# Patient Record
Sex: Female | Born: 1935
Health system: Southern US, Community
[De-identification: ages and names within clinical notes are randomized; demographics above are authoritative.]

## PROBLEM LIST (undated history)

## (undated) DIAGNOSIS — I10 Essential (primary) hypertension: Secondary | ICD-10-CM

## (undated) DIAGNOSIS — E119 Type 2 diabetes mellitus without complications: Secondary | ICD-10-CM

## (undated) DIAGNOSIS — F329 Major depressive disorder, single episode, unspecified: Secondary | ICD-10-CM

## (undated) DIAGNOSIS — N189 Chronic kidney disease, unspecified: Secondary | ICD-10-CM

## (undated) DIAGNOSIS — C9210 Chronic myeloid leukemia, BCR/ABL-positive, not having achieved remission: Secondary | ICD-10-CM

## (undated) DIAGNOSIS — F32A Depression, unspecified: Secondary | ICD-10-CM

## (undated) DIAGNOSIS — E785 Hyperlipidemia, unspecified: Secondary | ICD-10-CM

## (undated) DIAGNOSIS — E079 Disorder of thyroid, unspecified: Secondary | ICD-10-CM

## (undated) DIAGNOSIS — D649 Anemia, unspecified: Secondary | ICD-10-CM

## (undated) HISTORY — DX: Major depressive disorder, single episode, unspecified: F32.9

## (undated) HISTORY — DX: Disorder of thyroid, unspecified: E07.9

## (undated) HISTORY — DX: Type 2 diabetes mellitus without complications: E11.9

## (undated) HISTORY — PX: PTCA: SHX146

## (undated) HISTORY — DX: Chronic myeloid leukemia, BCR/ABL-positive, not having achieved remission: C92.10

## (undated) HISTORY — DX: Chronic kidney disease, unspecified: N18.9

## (undated) HISTORY — DX: Depression, unspecified: F32.A

## (undated) HISTORY — PX: APPENDECTOMY: SHX54

## (undated) HISTORY — DX: Essential (primary) hypertension: I10

## (undated) HISTORY — DX: Anemia, unspecified: D64.9

## (undated) HISTORY — DX: Hyperlipidemia, unspecified: E78.5

---

## 2011-10-10 DIAGNOSIS — C9211 Chronic myeloid leukemia, BCR/ABL-positive, in remission: Secondary | ICD-10-CM | POA: Diagnosis not present

## 2011-11-01 DIAGNOSIS — G5760 Lesion of plantar nerve, unspecified lower limb: Secondary | ICD-10-CM | POA: Diagnosis not present

## 2011-11-01 DIAGNOSIS — L03039 Cellulitis of unspecified toe: Secondary | ICD-10-CM | POA: Diagnosis not present

## 2012-01-03 DIAGNOSIS — B353 Tinea pedis: Secondary | ICD-10-CM | POA: Diagnosis not present

## 2012-01-03 DIAGNOSIS — B351 Tinea unguium: Secondary | ICD-10-CM | POA: Diagnosis not present

## 2012-01-03 DIAGNOSIS — L988 Other specified disorders of the skin and subcutaneous tissue: Secondary | ICD-10-CM | POA: Diagnosis not present

## 2012-01-03 DIAGNOSIS — E119 Type 2 diabetes mellitus without complications: Secondary | ICD-10-CM | POA: Diagnosis not present

## 2012-01-03 DIAGNOSIS — L600 Ingrowing nail: Secondary | ICD-10-CM | POA: Diagnosis not present

## 2012-01-11 DIAGNOSIS — I10 Essential (primary) hypertension: Secondary | ICD-10-CM | POA: Diagnosis not present

## 2012-01-11 DIAGNOSIS — D649 Anemia, unspecified: Secondary | ICD-10-CM | POA: Diagnosis not present

## 2012-01-11 DIAGNOSIS — E119 Type 2 diabetes mellitus without complications: Secondary | ICD-10-CM | POA: Diagnosis not present

## 2012-01-11 DIAGNOSIS — N2581 Secondary hyperparathyroidism of renal origin: Secondary | ICD-10-CM | POA: Diagnosis not present

## 2012-01-11 DIAGNOSIS — K7689 Other specified diseases of liver: Secondary | ICD-10-CM | POA: Diagnosis not present

## 2012-01-11 DIAGNOSIS — C9211 Chronic myeloid leukemia, BCR/ABL-positive, in remission: Secondary | ICD-10-CM | POA: Diagnosis not present

## 2012-01-11 DIAGNOSIS — R799 Abnormal finding of blood chemistry, unspecified: Secondary | ICD-10-CM | POA: Diagnosis not present

## 2012-01-16 DIAGNOSIS — C9211 Chronic myeloid leukemia, BCR/ABL-positive, in remission: Secondary | ICD-10-CM | POA: Diagnosis not present

## 2012-02-14 DIAGNOSIS — E119 Type 2 diabetes mellitus without complications: Secondary | ICD-10-CM | POA: Diagnosis not present

## 2012-02-14 DIAGNOSIS — I10 Essential (primary) hypertension: Secondary | ICD-10-CM | POA: Diagnosis not present

## 2012-02-14 DIAGNOSIS — H4089 Other specified glaucoma: Secondary | ICD-10-CM | POA: Diagnosis not present

## 2012-02-14 DIAGNOSIS — E039 Hypothyroidism, unspecified: Secondary | ICD-10-CM | POA: Diagnosis not present

## 2012-02-14 DIAGNOSIS — C9210 Chronic myeloid leukemia, BCR/ABL-positive, not having achieved remission: Secondary | ICD-10-CM | POA: Diagnosis not present

## 2012-02-14 DIAGNOSIS — I2510 Atherosclerotic heart disease of native coronary artery without angina pectoris: Secondary | ICD-10-CM | POA: Diagnosis not present

## 2012-02-14 DIAGNOSIS — G609 Hereditary and idiopathic neuropathy, unspecified: Secondary | ICD-10-CM | POA: Diagnosis not present

## 2012-03-06 DIAGNOSIS — B351 Tinea unguium: Secondary | ICD-10-CM | POA: Diagnosis not present

## 2012-03-06 DIAGNOSIS — L600 Ingrowing nail: Secondary | ICD-10-CM | POA: Diagnosis not present

## 2012-03-06 DIAGNOSIS — I70219 Atherosclerosis of native arteries of extremities with intermittent claudication, unspecified extremity: Secondary | ICD-10-CM | POA: Diagnosis not present

## 2012-03-06 DIAGNOSIS — E119 Type 2 diabetes mellitus without complications: Secondary | ICD-10-CM | POA: Diagnosis not present

## 2012-03-06 DIAGNOSIS — L97509 Non-pressure chronic ulcer of other part of unspecified foot with unspecified severity: Secondary | ICD-10-CM | POA: Diagnosis not present

## 2012-03-12 DIAGNOSIS — I2510 Atherosclerotic heart disease of native coronary artery without angina pectoris: Secondary | ICD-10-CM | POA: Diagnosis not present

## 2012-04-02 DIAGNOSIS — I2510 Atherosclerotic heart disease of native coronary artery without angina pectoris: Secondary | ICD-10-CM | POA: Diagnosis not present

## 2012-04-02 DIAGNOSIS — N390 Urinary tract infection, site not specified: Secondary | ICD-10-CM | POA: Diagnosis not present

## 2012-04-02 DIAGNOSIS — I10 Essential (primary) hypertension: Secondary | ICD-10-CM | POA: Diagnosis not present

## 2012-04-02 DIAGNOSIS — E119 Type 2 diabetes mellitus without complications: Secondary | ICD-10-CM | POA: Diagnosis not present

## 2012-04-02 DIAGNOSIS — E039 Hypothyroidism, unspecified: Secondary | ICD-10-CM | POA: Diagnosis not present

## 2012-04-02 DIAGNOSIS — D649 Anemia, unspecified: Secondary | ICD-10-CM | POA: Diagnosis not present

## 2012-04-02 DIAGNOSIS — C9210 Chronic myeloid leukemia, BCR/ABL-positive, not having achieved remission: Secondary | ICD-10-CM | POA: Diagnosis not present

## 2012-04-02 DIAGNOSIS — G609 Hereditary and idiopathic neuropathy, unspecified: Secondary | ICD-10-CM | POA: Diagnosis not present

## 2012-04-19 DIAGNOSIS — L97909 Non-pressure chronic ulcer of unspecified part of unspecified lower leg with unspecified severity: Secondary | ICD-10-CM | POA: Diagnosis not present

## 2012-06-21 DIAGNOSIS — E119 Type 2 diabetes mellitus without complications: Secondary | ICD-10-CM | POA: Diagnosis not present

## 2012-06-21 DIAGNOSIS — I8390 Asymptomatic varicose veins of unspecified lower extremity: Secondary | ICD-10-CM | POA: Diagnosis not present

## 2012-06-21 DIAGNOSIS — B351 Tinea unguium: Secondary | ICD-10-CM | POA: Diagnosis not present

## 2012-06-21 DIAGNOSIS — L600 Ingrowing nail: Secondary | ICD-10-CM | POA: Diagnosis not present

## 2012-06-21 DIAGNOSIS — L97509 Non-pressure chronic ulcer of other part of unspecified foot with unspecified severity: Secondary | ICD-10-CM | POA: Diagnosis not present

## 2012-07-18 DIAGNOSIS — C9211 Chronic myeloid leukemia, BCR/ABL-positive, in remission: Secondary | ICD-10-CM | POA: Diagnosis not present

## 2012-07-23 DIAGNOSIS — I10 Essential (primary) hypertension: Secondary | ICD-10-CM | POA: Diagnosis not present

## 2012-07-23 DIAGNOSIS — E039 Hypothyroidism, unspecified: Secondary | ICD-10-CM | POA: Diagnosis not present

## 2012-07-23 DIAGNOSIS — C9210 Chronic myeloid leukemia, BCR/ABL-positive, not having achieved remission: Secondary | ICD-10-CM | POA: Diagnosis not present

## 2012-07-23 DIAGNOSIS — E119 Type 2 diabetes mellitus without complications: Secondary | ICD-10-CM | POA: Diagnosis not present

## 2012-08-30 DIAGNOSIS — I10 Essential (primary) hypertension: Secondary | ICD-10-CM | POA: Diagnosis not present

## 2012-09-05 DIAGNOSIS — E119 Type 2 diabetes mellitus without complications: Secondary | ICD-10-CM | POA: Diagnosis not present

## 2012-09-05 DIAGNOSIS — N289 Disorder of kidney and ureter, unspecified: Secondary | ICD-10-CM | POA: Diagnosis not present

## 2012-09-05 DIAGNOSIS — I10 Essential (primary) hypertension: Secondary | ICD-10-CM | POA: Diagnosis not present

## 2012-09-06 DIAGNOSIS — L97509 Non-pressure chronic ulcer of other part of unspecified foot with unspecified severity: Secondary | ICD-10-CM | POA: Diagnosis not present

## 2012-09-06 DIAGNOSIS — B353 Tinea pedis: Secondary | ICD-10-CM | POA: Diagnosis not present

## 2012-09-06 DIAGNOSIS — L988 Other specified disorders of the skin and subcutaneous tissue: Secondary | ICD-10-CM | POA: Diagnosis not present

## 2012-09-06 DIAGNOSIS — B351 Tinea unguium: Secondary | ICD-10-CM | POA: Diagnosis not present

## 2012-09-06 DIAGNOSIS — E1149 Type 2 diabetes mellitus with other diabetic neurological complication: Secondary | ICD-10-CM | POA: Diagnosis not present

## 2012-09-06 DIAGNOSIS — L608 Other nail disorders: Secondary | ICD-10-CM | POA: Diagnosis not present

## 2012-10-18 DIAGNOSIS — L988 Other specified disorders of the skin and subcutaneous tissue: Secondary | ICD-10-CM | POA: Diagnosis not present

## 2012-10-18 DIAGNOSIS — L97509 Non-pressure chronic ulcer of other part of unspecified foot with unspecified severity: Secondary | ICD-10-CM | POA: Diagnosis not present

## 2012-10-18 DIAGNOSIS — B353 Tinea pedis: Secondary | ICD-10-CM | POA: Diagnosis not present

## 2012-11-23 DIAGNOSIS — I10 Essential (primary) hypertension: Secondary | ICD-10-CM | POA: Diagnosis not present

## 2012-11-23 DIAGNOSIS — E039 Hypothyroidism, unspecified: Secondary | ICD-10-CM | POA: Diagnosis not present

## 2013-01-03 DIAGNOSIS — L608 Other nail disorders: Secondary | ICD-10-CM | POA: Diagnosis not present

## 2013-01-03 DIAGNOSIS — B351 Tinea unguium: Secondary | ICD-10-CM | POA: Diagnosis not present

## 2013-01-03 DIAGNOSIS — E1149 Type 2 diabetes mellitus with other diabetic neurological complication: Secondary | ICD-10-CM | POA: Diagnosis not present

## 2013-01-03 DIAGNOSIS — M79609 Pain in unspecified limb: Secondary | ICD-10-CM | POA: Diagnosis not present

## 2013-01-03 DIAGNOSIS — L97509 Non-pressure chronic ulcer of other part of unspecified foot with unspecified severity: Secondary | ICD-10-CM | POA: Diagnosis not present

## 2013-01-23 DIAGNOSIS — C9211 Chronic myeloid leukemia, BCR/ABL-positive, in remission: Secondary | ICD-10-CM | POA: Diagnosis not present

## 2013-03-05 DIAGNOSIS — E039 Hypothyroidism, unspecified: Secondary | ICD-10-CM | POA: Diagnosis not present

## 2013-03-05 DIAGNOSIS — E785 Hyperlipidemia, unspecified: Secondary | ICD-10-CM | POA: Diagnosis not present

## 2013-03-05 DIAGNOSIS — N289 Disorder of kidney and ureter, unspecified: Secondary | ICD-10-CM | POA: Diagnosis not present

## 2013-03-05 DIAGNOSIS — E119 Type 2 diabetes mellitus without complications: Secondary | ICD-10-CM | POA: Diagnosis not present

## 2013-03-05 DIAGNOSIS — Z0000 Encounter for general adult medical examination without abnormal findings: Secondary | ICD-10-CM | POA: Diagnosis not present

## 2013-03-05 DIAGNOSIS — E559 Vitamin D deficiency, unspecified: Secondary | ICD-10-CM | POA: Diagnosis not present

## 2013-05-06 DIAGNOSIS — E039 Hypothyroidism, unspecified: Secondary | ICD-10-CM | POA: Diagnosis not present

## 2013-05-17 DIAGNOSIS — B351 Tinea unguium: Secondary | ICD-10-CM | POA: Diagnosis not present

## 2013-05-17 DIAGNOSIS — L97509 Non-pressure chronic ulcer of other part of unspecified foot with unspecified severity: Secondary | ICD-10-CM | POA: Diagnosis not present

## 2013-05-17 DIAGNOSIS — L259 Unspecified contact dermatitis, unspecified cause: Secondary | ICD-10-CM | POA: Diagnosis not present

## 2013-05-17 DIAGNOSIS — L03039 Cellulitis of unspecified toe: Secondary | ICD-10-CM | POA: Diagnosis not present

## 2013-05-17 DIAGNOSIS — M79609 Pain in unspecified limb: Secondary | ICD-10-CM | POA: Diagnosis not present

## 2013-06-05 DIAGNOSIS — E039 Hypothyroidism, unspecified: Secondary | ICD-10-CM | POA: Diagnosis not present

## 2013-06-05 DIAGNOSIS — I10 Essential (primary) hypertension: Secondary | ICD-10-CM | POA: Diagnosis not present

## 2013-06-05 DIAGNOSIS — E119 Type 2 diabetes mellitus without complications: Secondary | ICD-10-CM | POA: Diagnosis not present

## 2013-06-05 DIAGNOSIS — N289 Disorder of kidney and ureter, unspecified: Secondary | ICD-10-CM | POA: Diagnosis not present

## 2013-06-05 DIAGNOSIS — C9210 Chronic myeloid leukemia, BCR/ABL-positive, not having achieved remission: Secondary | ICD-10-CM | POA: Diagnosis not present

## 2013-07-15 DIAGNOSIS — L03039 Cellulitis of unspecified toe: Secondary | ICD-10-CM | POA: Diagnosis not present

## 2013-07-15 DIAGNOSIS — L97509 Non-pressure chronic ulcer of other part of unspecified foot with unspecified severity: Secondary | ICD-10-CM | POA: Diagnosis not present

## 2013-09-11 DIAGNOSIS — C9211 Chronic myeloid leukemia, BCR/ABL-positive, in remission: Secondary | ICD-10-CM | POA: Diagnosis not present

## 2013-09-12 DIAGNOSIS — B351 Tinea unguium: Secondary | ICD-10-CM | POA: Diagnosis not present

## 2013-09-12 DIAGNOSIS — L97509 Non-pressure chronic ulcer of other part of unspecified foot with unspecified severity: Secondary | ICD-10-CM | POA: Diagnosis not present

## 2013-09-12 DIAGNOSIS — M79609 Pain in unspecified limb: Secondary | ICD-10-CM | POA: Diagnosis not present

## 2013-10-15 DIAGNOSIS — N289 Disorder of kidney and ureter, unspecified: Secondary | ICD-10-CM | POA: Diagnosis not present

## 2013-10-15 DIAGNOSIS — E039 Hypothyroidism, unspecified: Secondary | ICD-10-CM | POA: Diagnosis not present

## 2013-10-15 DIAGNOSIS — E559 Vitamin D deficiency, unspecified: Secondary | ICD-10-CM | POA: Diagnosis not present

## 2013-10-15 DIAGNOSIS — Z1331 Encounter for screening for depression: Secondary | ICD-10-CM | POA: Diagnosis not present

## 2013-10-15 DIAGNOSIS — E119 Type 2 diabetes mellitus without complications: Secondary | ICD-10-CM | POA: Diagnosis not present

## 2013-12-19 DIAGNOSIS — I739 Peripheral vascular disease, unspecified: Secondary | ICD-10-CM | POA: Diagnosis not present

## 2013-12-19 DIAGNOSIS — L97409 Non-pressure chronic ulcer of unspecified heel and midfoot with unspecified severity: Secondary | ICD-10-CM | POA: Diagnosis not present

## 2014-02-05 DIAGNOSIS — E559 Vitamin D deficiency, unspecified: Secondary | ICD-10-CM | POA: Diagnosis not present

## 2014-02-05 DIAGNOSIS — N289 Disorder of kidney and ureter, unspecified: Secondary | ICD-10-CM | POA: Diagnosis not present

## 2014-02-05 DIAGNOSIS — E119 Type 2 diabetes mellitus without complications: Secondary | ICD-10-CM | POA: Diagnosis not present

## 2014-02-05 DIAGNOSIS — I10 Essential (primary) hypertension: Secondary | ICD-10-CM | POA: Diagnosis not present

## 2014-02-05 DIAGNOSIS — R0789 Other chest pain: Secondary | ICD-10-CM | POA: Diagnosis not present

## 2014-02-05 DIAGNOSIS — E039 Hypothyroidism, unspecified: Secondary | ICD-10-CM | POA: Diagnosis not present

## 2014-05-26 DIAGNOSIS — M79609 Pain in unspecified limb: Secondary | ICD-10-CM | POA: Diagnosis not present

## 2014-05-26 DIAGNOSIS — L97409 Non-pressure chronic ulcer of unspecified heel and midfoot with unspecified severity: Secondary | ICD-10-CM | POA: Diagnosis not present

## 2014-05-26 DIAGNOSIS — B351 Tinea unguium: Secondary | ICD-10-CM | POA: Diagnosis not present

## 2014-05-26 DIAGNOSIS — L03039 Cellulitis of unspecified toe: Secondary | ICD-10-CM | POA: Diagnosis not present

## 2014-08-26 DIAGNOSIS — Z79899 Other long term (current) drug therapy: Secondary | ICD-10-CM | POA: Diagnosis not present

## 2014-08-26 DIAGNOSIS — H540 Blindness, both eyes: Secondary | ICD-10-CM | POA: Diagnosis not present

## 2014-08-26 DIAGNOSIS — H3530 Unspecified macular degeneration: Secondary | ICD-10-CM | POA: Diagnosis not present

## 2014-08-26 DIAGNOSIS — C92Z0 Other myeloid leukemia not having achieved remission: Secondary | ICD-10-CM | POA: Diagnosis not present

## 2014-08-26 DIAGNOSIS — H409 Unspecified glaucoma: Secondary | ICD-10-CM | POA: Diagnosis not present

## 2014-08-26 DIAGNOSIS — E785 Hyperlipidemia, unspecified: Secondary | ICD-10-CM | POA: Diagnosis not present

## 2014-08-26 DIAGNOSIS — E119 Type 2 diabetes mellitus without complications: Secondary | ICD-10-CM | POA: Diagnosis not present

## 2014-08-26 DIAGNOSIS — F329 Major depressive disorder, single episode, unspecified: Secondary | ICD-10-CM | POA: Diagnosis not present

## 2014-08-26 DIAGNOSIS — I2510 Atherosclerotic heart disease of native coronary artery without angina pectoris: Secondary | ICD-10-CM | POA: Diagnosis not present

## 2014-08-26 DIAGNOSIS — E559 Vitamin D deficiency, unspecified: Secondary | ICD-10-CM | POA: Diagnosis not present

## 2014-08-26 DIAGNOSIS — Z1389 Encounter for screening for other disorder: Secondary | ICD-10-CM | POA: Diagnosis not present

## 2014-09-01 DIAGNOSIS — H349 Unspecified retinal vascular occlusion: Secondary | ICD-10-CM | POA: Diagnosis not present

## 2014-09-01 DIAGNOSIS — D3131 Benign neoplasm of right choroid: Secondary | ICD-10-CM | POA: Diagnosis not present

## 2014-09-01 DIAGNOSIS — H2513 Age-related nuclear cataract, bilateral: Secondary | ICD-10-CM | POA: Diagnosis not present

## 2014-09-04 DIAGNOSIS — H34812 Central retinal vein occlusion, left eye: Secondary | ICD-10-CM | POA: Diagnosis not present

## 2014-09-04 DIAGNOSIS — H3531 Nonexudative age-related macular degeneration: Secondary | ICD-10-CM | POA: Diagnosis not present

## 2014-09-04 DIAGNOSIS — H35352 Cystoid macular degeneration, left eye: Secondary | ICD-10-CM | POA: Diagnosis not present

## 2014-09-09 ENCOUNTER — Telehealth: Payer: Self-pay | Admitting: Oncology

## 2014-09-09 NOTE — Telephone Encounter (Signed)
s/w patient son and gave np appt for 12/30 @ 3 w/Dr. Jana Hakim, labs 12/21 @ 3:30.

## 2014-09-12 ENCOUNTER — Other Ambulatory Visit: Payer: Self-pay | Admitting: *Deleted

## 2014-09-12 DIAGNOSIS — C9110 Chronic lymphocytic leukemia of B-cell type not having achieved remission: Secondary | ICD-10-CM

## 2014-09-15 ENCOUNTER — Encounter (HOSPITAL_BASED_OUTPATIENT_CLINIC_OR_DEPARTMENT_OTHER): Payer: Self-pay | Admitting: Oncology

## 2014-09-15 ENCOUNTER — Other Ambulatory Visit: Payer: Self-pay

## 2014-09-15 ENCOUNTER — Other Ambulatory Visit: Payer: Self-pay | Admitting: Oncology

## 2014-09-15 DIAGNOSIS — C9210 Chronic myeloid leukemia, BCR/ABL-positive, not having achieved remission: Secondary | ICD-10-CM | POA: Diagnosis not present

## 2014-09-15 DIAGNOSIS — C9110 Chronic lymphocytic leukemia of B-cell type not having achieved remission: Secondary | ICD-10-CM

## 2014-09-15 LAB — COMPREHENSIVE METABOLIC PANEL (CC13)
ALBUMIN: 3.6 g/dL (ref 3.5–5.0)
ALT: 22 U/L (ref 0–55)
AST: 21 U/L (ref 5–34)
Alkaline Phosphatase: 120 U/L (ref 40–150)
Anion Gap: 12 mEq/L — ABNORMAL HIGH (ref 3–11)
BUN: 30.2 mg/dL — AB (ref 7.0–26.0)
CALCIUM: 8.7 mg/dL (ref 8.4–10.4)
CO2: 23 mEq/L (ref 22–29)
Chloride: 106 mEq/L (ref 98–109)
Creatinine: 2.2 mg/dL — ABNORMAL HIGH (ref 0.6–1.1)
EGFR: 20 mL/min/{1.73_m2} — AB (ref >90)
Glucose: 180 mg/dl — ABNORMAL HIGH (ref 70–140)
Potassium: 4.9 mEq/L (ref 3.5–5.1)
SODIUM: 141 meq/L (ref 136–145)
TOTAL PROTEIN: 7.4 g/dL (ref 6.4–8.3)
Total Bilirubin: 0.46 mg/dL (ref 0.20–1.20)

## 2014-09-15 LAB — CBC WITH DIFFERENTIAL/PLATELET
BASO%: 1.1 % (ref 0.0–2.0)
Basophils Absolute: 0.1 10*3/uL (ref 0.0–0.1)
EOS ABS: 0.2 10*3/uL (ref 0.0–0.5)
EOS%: 3.9 % (ref 0.0–7.0)
HCT: 34.3 % — ABNORMAL LOW (ref 34.8–46.6)
HGB: 10.7 g/dL — ABNORMAL LOW (ref 11.6–15.9)
LYMPH%: 16.7 % (ref 14.0–49.7)
MCH: 26 pg (ref 25.1–34.0)
MCHC: 31.2 g/dL — ABNORMAL LOW (ref 31.5–36.0)
MCV: 83.3 fL (ref 79.5–101.0)
MONO#: 0.7 10*3/uL (ref 0.1–0.9)
MONO%: 11.8 % (ref 0.0–14.0)
NEUT#: 3.9 10*3/uL (ref 1.5–6.5)
NEUT%: 66.5 % (ref 38.4–76.8)
Platelets: 180 10*3/uL (ref 145–400)
RBC: 4.12 10*6/uL (ref 3.70–5.45)
RDW: 17.4 % — ABNORMAL HIGH (ref 11.2–14.5)
WBC: 5.9 10*3/uL (ref 3.9–10.3)
lymph#: 1 10*3/uL (ref 0.9–3.3)

## 2014-09-15 LAB — LACTATE DEHYDROGENASE (CC13): LDH: 300 U/L — AB (ref 125–245)

## 2014-09-15 LAB — CHCC SMEAR

## 2014-09-15 LAB — TECHNOLOGIST REVIEW: Technologist Review: NONE SEEN

## 2014-09-16 ENCOUNTER — Encounter: Payer: Self-pay | Admitting: Emergency Medicine

## 2014-09-16 DIAGNOSIS — C9210 Chronic myeloid leukemia, BCR/ABL-positive, not having achieved remission: Secondary | ICD-10-CM | POA: Insufficient documentation

## 2014-09-22 DIAGNOSIS — H349 Unspecified retinal vascular occlusion: Secondary | ICD-10-CM | POA: Diagnosis not present

## 2014-09-22 DIAGNOSIS — H2513 Age-related nuclear cataract, bilateral: Secondary | ICD-10-CM | POA: Diagnosis not present

## 2014-09-23 DIAGNOSIS — Z780 Asymptomatic menopausal state: Secondary | ICD-10-CM | POA: Diagnosis not present

## 2014-09-24 ENCOUNTER — Ambulatory Visit (HOSPITAL_BASED_OUTPATIENT_CLINIC_OR_DEPARTMENT_OTHER): Payer: Medicare Other | Admitting: Oncology

## 2014-09-24 ENCOUNTER — Ambulatory Visit: Payer: Medicare Other

## 2014-09-24 ENCOUNTER — Encounter: Payer: Self-pay | Admitting: Oncology

## 2014-09-24 VITALS — BP 196/66 | HR 66 | Temp 98.3°F | Resp 22 | Ht 62.0 in | Wt 126.0 lb

## 2014-09-24 DIAGNOSIS — R7309 Other abnormal glucose: Secondary | ICD-10-CM | POA: Diagnosis not present

## 2014-09-24 DIAGNOSIS — N184 Chronic kidney disease, stage 4 (severe): Secondary | ICD-10-CM | POA: Diagnosis not present

## 2014-09-24 DIAGNOSIS — N189 Chronic kidney disease, unspecified: Secondary | ICD-10-CM | POA: Diagnosis not present

## 2014-09-24 DIAGNOSIS — E785 Hyperlipidemia, unspecified: Secondary | ICD-10-CM | POA: Diagnosis not present

## 2014-09-24 DIAGNOSIS — I2510 Atherosclerotic heart disease of native coronary artery without angina pectoris: Secondary | ICD-10-CM

## 2014-09-24 DIAGNOSIS — D630 Anemia in neoplastic disease: Secondary | ICD-10-CM | POA: Diagnosis not present

## 2014-09-24 DIAGNOSIS — H3530 Unspecified macular degeneration: Secondary | ICD-10-CM

## 2014-09-24 DIAGNOSIS — C9210 Chronic myeloid leukemia, BCR/ABL-positive, not having achieved remission: Secondary | ICD-10-CM

## 2014-09-24 DIAGNOSIS — E1122 Type 2 diabetes mellitus with diabetic chronic kidney disease: Secondary | ICD-10-CM | POA: Diagnosis not present

## 2014-09-24 DIAGNOSIS — E038 Other specified hypothyroidism: Secondary | ICD-10-CM | POA: Diagnosis not present

## 2014-09-24 DIAGNOSIS — Z9861 Coronary angioplasty status: Secondary | ICD-10-CM | POA: Diagnosis not present

## 2014-09-24 DIAGNOSIS — R7303 Prediabetes: Secondary | ICD-10-CM

## 2014-09-24 NOTE — Progress Notes (Signed)
Checked in new patient with no issues. Her son said she has been approved for medicaid and has not recd card yet. I told him to make sure we get card once they get.

## 2014-09-27 DIAGNOSIS — N184 Chronic kidney disease, stage 4 (severe): Secondary | ICD-10-CM | POA: Insufficient documentation

## 2014-09-27 DIAGNOSIS — E785 Hyperlipidemia, unspecified: Secondary | ICD-10-CM | POA: Insufficient documentation

## 2014-09-27 DIAGNOSIS — R7303 Prediabetes: Secondary | ICD-10-CM | POA: Insufficient documentation

## 2014-09-27 DIAGNOSIS — E119 Type 2 diabetes mellitus without complications: Secondary | ICD-10-CM | POA: Insufficient documentation

## 2014-09-27 DIAGNOSIS — E039 Hypothyroidism, unspecified: Secondary | ICD-10-CM | POA: Insufficient documentation

## 2014-09-27 DIAGNOSIS — D630 Anemia in neoplastic disease: Secondary | ICD-10-CM | POA: Insufficient documentation

## 2014-09-27 DIAGNOSIS — Z9861 Coronary angioplasty status: Secondary | ICD-10-CM

## 2014-09-27 DIAGNOSIS — H3530 Unspecified macular degeneration: Secondary | ICD-10-CM | POA: Insufficient documentation

## 2014-09-27 DIAGNOSIS — I2510 Atherosclerotic heart disease of native coronary artery without angina pectoris: Secondary | ICD-10-CM | POA: Insufficient documentation

## 2014-09-27 NOTE — Progress Notes (Signed)
[facility]  Telephone:(336) [phone] Fax:(336) [phone]     ID: Stacy Hendricks DOB: [birth year]  MR#: [id number]  VOZ#:[id number]  Patient Care Team: Stacy Ly, MD as PCP - General (Internal Medicine) PCP: Stacy Ly, MD GYN: SU:  OTHER MD: Stacy Ivan MD (Stacy Hendricks, phone [phone])  CHIEF COMPLAINT: Chronic myeloid leukemia  CURRENT TREATMENT: Nilotinib   HISTORY OF PRESENT ILLNESS We do not have the records from her [facility] hematologist, but according to the patient: She has had a diagnosis of chronic myeloid leukemia for more than 10 years. She tells me she had "several treatments" which did not work (10 years ago we were using interferon, Hydrea, and oral chemotherapy agents) but that she tried imatinib initially, with no response. She was started on nilotinib "as soon as it came out" (which would've been late 2007) and has been on that medication with good control ever since.  INTERVAL HISTORY:  Stacy Hendricks was evaluated in the hematology clinic 09/24/2014 accompanied by her son Stacy Hendricks.  REVIEW OF SYSTEMS: She tolerates the nilotinib with no side effects that she is aware of. Specifically she denies problems with rash, diarrhea, or edema. She obtains it "for free". What bothers her most is not being able to see because of her eye problems (glaucoma and macular degeneration and doubtless diabetic retinopathy as well). She denies fevers, bleeding, falls, significant weight loss, pain, or other major concerns. A detailed review of systems today was otherwise noncontributory  PAST MEDICAL HISTORY: No past medical history on file.  Include macular degeneration, glaucoma, depression, diabetes mellitus type 2, coronary artery disease status post stenting of native vessel, hyperlipidemia, chronic kidney disease, and hypertension  PAST SURGICAL HISTORY: No past surgical history on file.  Status post appendectomy  FAMILY HISTORY No family history on file.  The  patient's father died from a heart attack at the age of 42. The patient's mother died of "old age" at age 23 following a stroke. The patient is an only child.  GYNECOLOGIC HISTORY:  No LMP recorded.  Menarche age 22, first live birth age 63, she is [place] P1.   SOCIAL HISTORY:  Stacy Hendricks was born in [place]. She worked as a Licensed conveyancer. Her husband of 60 years, Stacy Hendricks, is a retired Chief Financial Officer. Their son, Stacy Hendricks"), moved to [place] 2 years ago and that is why the patient and her husband moved here from [place].    ADVANCED DIRECTIVES:    HEALTH MAINTENANCE: History  Substance Use Topics  . Smoking status: Not on file  . Smokeless tobacco: Not on file  . Alcohol Use: Not on file     Colonoscopy: Never  PAP: Remote  Bone density:  Lipid panel:  Mammography: Refuses  No Known Allergies  Current Outpatient Prescriptions  Medication Sig Dispense Refill  . carvedilol (COREG) 12.5 MG tablet Take 12.5 mg by mouth 2 (two) times daily with a meal.    . clopidogrel (PLAVIX) 75 MG tablet     . CRESTOR 10 MG tablet     . escitalopram (LEXAPRO) 10 MG tablet     . HUMALOG MIX 50/50 KWIKPEN (50-50) 100 UNIT/ML Kwikpen     . JANUVIA 25 MG tablet     . levothyroxine (SYNTHROID, LEVOTHROID) 50 MCG tablet     . lisinopril (PRINIVIL,ZESTRIL) 20 MG tablet     . LUMIGAN 0.01 % SOLN     . nilotinib (TASIGNA) 200 MG capsule Take 400 mg by mouth every 12 (twelve) hours. Give on an  empty stomach 1 hour before or 2 hours after meals.    . COMBIGAN 0.2-0.5 % ophthalmic solution      No current facility-administered medications for this visit.    OBJECTIVE: Elderly white woman who appears stated age  32 Vitals:   09/24/14 1526  BP: 196/66  Pulse: 66  Temp: 98.3 F (36.8 C)  Resp: 22     Body mass index is 23.04 kg/(m^2).    ECOG FS:2 - Symptomatic, <50% confined to bed  Ocular: Sclerae unicteric, pupils round and equal  Ear-nose-throat: Oropharynx clear and moist  Lymphatic: No  cervical or supraclavicular adenopathy Lungs no rales or rhonchi Heart regular rate and rhythm Abd soft, nontender, positive bowel sounds, No splenomegaly  MSK no focal spinal tenderness Neuro: non-focal, well-oriented, appropriate affect Breasts: No masses palpated in either breast, no skin or nipple changes of concern, both axillae were benign.  LAB RESULTS:  Bcr.abl not detected in peripheral blood by quantitative RT-PCR 09/15/2014  CMP     Component Value Date/Time   NA 141 09/15/2014 1610   K 4.9 09/15/2014 1610   CO2 23 09/15/2014 1610   GLUCOSE 180* 09/15/2014 1610   BUN 30.2* 09/15/2014 1610   CREATININE 2.2* 09/15/2014 1610   CALCIUM 8.7 09/15/2014 1610   PROT 7.4 09/15/2014 1610   ALBUMIN 3.6 09/15/2014 1610   AST 21 09/15/2014 1610   ALT 22 09/15/2014 1610   ALKPHOS 120 09/15/2014 1610   BILITOT 0.46 09/15/2014 1610    INo results found for: SPEP, UPEP  Lab Results  Component Value Date   WBC 5.9 09/15/2014   NEUTROABS 3.9 09/15/2014   HGB 10.7* 09/15/2014   HCT 34.3* 09/15/2014   MCV 83.3 09/15/2014   PLT 180 09/15/2014      Chemistry      Component Value Date/Time   NA 141 09/15/2014 1610   K 4.9 09/15/2014 1610   CO2 23 09/15/2014 1610   BUN 30.2* 09/15/2014 1610   CREATININE 2.2* 09/15/2014 1610      Component Value Date/Time   CALCIUM 8.7 09/15/2014 1610   ALKPHOS 120 09/15/2014 1610   AST 21 09/15/2014 1610   ALT 22 09/15/2014 1610   BILITOT 0.46 09/15/2014 1610       No results found for: LABCA2  No components found for: LABCA125  No results for input(s): INR in the last 168 hours.  Urinalysis No results found for: COLORURINE, APPEARANCEUR, LABSPEC, PHURINE, GLUCOSEU, HGBUR, BILIRUBINUR, KETONESUR, PROTEINUR, UROBILINOGEN, NITRITE, LEUKOCYTESUR  STUDIES: No results found.  ASSESSMENT: 81 y.o. [place] woman with a history of chronic myeloid leukemia, on nilotinib since 2007 with continuing good control  PLAN: We spent a  little over an hour today going over Stacy Hendricks's history of CML, and the pathophysiology of that disease. She and her son understand it arises from a translocation that brings the BCR and ABL genes together resulting in a fusion product bcr.abl which is not found a normal white cells but only in chronic myeloid leukemia cells. We can detect very low levels of this fusion product in the blood and it is very favorable that right now we do not detect that at all in Harli's case. In rare cases there are CML cells in the marrow which show a further mutated form of this translocation, but in general my practice in stable patients like Destiny is simply to follow the blood work every 3 months and not do yearly bone marrow biopsies. They also understand that relapses are  rare this far out in the treatment course.    Purcell Nails requested  the records from her [facility] oncologist, Dr Max Fickle ([facility]/ Dept. Hematology, [phone]) for completion, but I do not believe that well affect our management plan.  I am concerned about her chronic kidney disease. Her creatinine clearance at Dr. Silvestre Mesi was 30.8, here it was 20, possibly due to variations in hydration. We did not clarify Chizara's intentions in terms of advance directives, but we will address that issue at the next visit. We did discuss possible referral to a nephrologist but they would like to defer that option until they can discuss it further with Dr. Josph Macho, MD   09/27/2014 11:16 AM Medical Oncology and Hematology [facility] [place],  [address] Tel. [phone]    Fax. [phone]

## 2014-09-29 ENCOUNTER — Telehealth: Payer: Self-pay | Admitting: Oncology

## 2014-09-29 NOTE — Telephone Encounter (Signed)
s.w. pt husband and advised on Feb May June aug and dec appt...ok and aware

## 2014-10-08 ENCOUNTER — Telehealth: Payer: Self-pay | Admitting: Oncology

## 2014-10-08 NOTE — Telephone Encounter (Signed)
Requested medical records to Dr. Verdell Carmine office. Fax # [phone]

## 2014-10-24 ENCOUNTER — Telehealth: Payer: Self-pay | Admitting: Emergency Medicine

## 2014-10-24 NOTE — Telephone Encounter (Signed)
Received a fax from [place] at [facility] stating no records found on this patient.  This nurse called the number per Dr Magrinat's note; the person with medical records at the [facility] states that she can not see any records on this patient from the last 8-10 years.   States that the records from prior to that would be in "storage".

## 2014-10-31 DIAGNOSIS — H401423 Capsular glaucoma with pseudoexfoliation of lens, left eye, severe stage: Secondary | ICD-10-CM | POA: Diagnosis not present

## 2014-10-31 DIAGNOSIS — H2513 Age-related nuclear cataract, bilateral: Secondary | ICD-10-CM | POA: Diagnosis not present

## 2014-11-11 ENCOUNTER — Telehealth: Payer: Self-pay | Admitting: *Deleted

## 2014-11-11 NOTE — Telephone Encounter (Signed)
This RN contacted the [facility] and spoke with Kendrick Fries in medical records.  She states pt's records are " off site "- per discussion she will request records and forwarded them ASAP.  Request for records refaxed to [facility] at [phone].

## 2014-11-18 DIAGNOSIS — E559 Vitamin D deficiency, unspecified: Secondary | ICD-10-CM | POA: Diagnosis not present

## 2014-11-24 ENCOUNTER — Other Ambulatory Visit (HOSPITAL_BASED_OUTPATIENT_CLINIC_OR_DEPARTMENT_OTHER): Payer: Medicare Other

## 2014-11-24 DIAGNOSIS — H3530 Unspecified macular degeneration: Secondary | ICD-10-CM

## 2014-11-24 DIAGNOSIS — C9210 Chronic myeloid leukemia, BCR/ABL-positive, not having achieved remission: Secondary | ICD-10-CM

## 2014-11-24 DIAGNOSIS — Z9861 Coronary angioplasty status: Secondary | ICD-10-CM

## 2014-11-24 DIAGNOSIS — E785 Hyperlipidemia, unspecified: Secondary | ICD-10-CM

## 2014-11-24 DIAGNOSIS — R7303 Prediabetes: Secondary | ICD-10-CM

## 2014-11-24 DIAGNOSIS — I2510 Atherosclerotic heart disease of native coronary artery without angina pectoris: Secondary | ICD-10-CM

## 2014-11-24 DIAGNOSIS — N184 Chronic kidney disease, stage 4 (severe): Secondary | ICD-10-CM

## 2014-11-24 DIAGNOSIS — D630 Anemia in neoplastic disease: Secondary | ICD-10-CM

## 2014-11-24 DIAGNOSIS — E038 Other specified hypothyroidism: Secondary | ICD-10-CM

## 2014-11-24 DIAGNOSIS — E1122 Type 2 diabetes mellitus with diabetic chronic kidney disease: Secondary | ICD-10-CM

## 2014-11-24 LAB — CBC WITH DIFFERENTIAL/PLATELET
BASO%: 1.2 % (ref 0.0–2.0)
Basophils Absolute: 0.1 10*3/uL (ref 0.0–0.1)
EOS%: 6.5 % (ref 0.0–7.0)
Eosinophils Absolute: 0.4 10*3/uL (ref 0.0–0.5)
HEMATOCRIT: 32.2 % — AB (ref 34.8–46.6)
HGB: 10.4 g/dL — ABNORMAL LOW (ref 11.6–15.9)
LYMPH%: 19.6 % (ref 14.0–49.7)
MCH: 26.5 pg (ref 25.1–34.0)
MCHC: 32.3 g/dL (ref 31.5–36.0)
MCV: 82.2 fL (ref 79.5–101.0)
MONO#: 0.8 10*3/uL (ref 0.1–0.9)
MONO%: 11.7 % (ref 0.0–14.0)
NEUT%: 61 % (ref 38.4–76.8)
NEUTROS ABS: 4.1 10*3/uL (ref 1.5–6.5)
PLATELETS: 171 10*3/uL (ref 145–400)
RBC: 3.92 10*6/uL (ref 3.70–5.45)
RDW: 17.2 % — ABNORMAL HIGH (ref 11.2–14.5)
WBC: 6.7 10*3/uL (ref 3.9–10.3)
lymph#: 1.3 10*3/uL (ref 0.9–3.3)

## 2014-11-24 LAB — COMPREHENSIVE METABOLIC PANEL (CC13)
ALT: 18 U/L (ref 0–55)
ANION GAP: 5 meq/L (ref 3–11)
AST: 22 U/L (ref 5–34)
Albumin: 3.5 g/dL (ref 3.5–5.0)
Alkaline Phosphatase: 121 U/L (ref 40–150)
BUN: 27.9 mg/dL — AB (ref 7.0–26.0)
CO2: 20 meq/L — AB (ref 22–29)
CREATININE: 1.9 mg/dL — AB (ref 0.6–1.1)
Calcium: 8.9 mg/dL (ref 8.4–10.4)
Chloride: 114 mEq/L — ABNORMAL HIGH (ref 98–109)
EGFR: 24 mL/min/{1.73_m2} — ABNORMAL LOW (ref >90)
Glucose: 114 mg/dl (ref 70–140)
Potassium: 4.1 mEq/L (ref 3.5–5.1)
Sodium: 139 mEq/L (ref 136–145)
Total Bilirubin: 0.56 mg/dL (ref 0.20–1.20)
Total Protein: 7.1 g/dL (ref 6.4–8.3)

## 2014-12-04 DIAGNOSIS — I129 Hypertensive chronic kidney disease with stage 1 through stage 4 chronic kidney disease, or unspecified chronic kidney disease: Secondary | ICD-10-CM | POA: Diagnosis not present

## 2014-12-04 DIAGNOSIS — N189 Chronic kidney disease, unspecified: Secondary | ICD-10-CM | POA: Diagnosis not present

## 2014-12-04 DIAGNOSIS — E1129 Type 2 diabetes mellitus with other diabetic kidney complication: Secondary | ICD-10-CM | POA: Diagnosis not present

## 2014-12-04 DIAGNOSIS — E785 Hyperlipidemia, unspecified: Secondary | ICD-10-CM | POA: Diagnosis not present

## 2014-12-04 DIAGNOSIS — D649 Anemia, unspecified: Secondary | ICD-10-CM | POA: Diagnosis not present

## 2014-12-10 ENCOUNTER — Other Ambulatory Visit: Payer: Self-pay | Admitting: Nephrology

## 2014-12-10 DIAGNOSIS — N183 Chronic kidney disease, stage 3 unspecified: Secondary | ICD-10-CM

## 2014-12-10 DIAGNOSIS — E1022 Type 1 diabetes mellitus with diabetic chronic kidney disease: Secondary | ICD-10-CM

## 2014-12-11 ENCOUNTER — Ambulatory Visit (HOSPITAL_COMMUNITY)
Admission: RE | Admit: 2014-12-11 | Discharge: 2014-12-11 | Disposition: A | Discharge status: Home / Self Care | Payer: Medicare Other | Source: Ambulatory Visit | Attending: Internal Medicine | Admitting: Internal Medicine

## 2014-12-11 DIAGNOSIS — I129 Hypertensive chronic kidney disease with stage 1 through stage 4 chronic kidney disease, or unspecified chronic kidney disease: Secondary | ICD-10-CM | POA: Diagnosis not present

## 2014-12-11 DIAGNOSIS — R809 Proteinuria, unspecified: Secondary | ICD-10-CM | POA: Diagnosis not present

## 2014-12-11 DIAGNOSIS — N189 Chronic kidney disease, unspecified: Secondary | ICD-10-CM | POA: Diagnosis not present

## 2014-12-12 ENCOUNTER — Other Ambulatory Visit: Payer: Medicare Other

## 2014-12-19 ENCOUNTER — Other Ambulatory Visit: Payer: Medicare Other

## 2015-01-23 DIAGNOSIS — E1129 Type 2 diabetes mellitus with other diabetic kidney complication: Secondary | ICD-10-CM | POA: Diagnosis not present

## 2015-01-23 DIAGNOSIS — E785 Hyperlipidemia, unspecified: Secondary | ICD-10-CM | POA: Diagnosis not present

## 2015-01-23 DIAGNOSIS — I129 Hypertensive chronic kidney disease with stage 1 through stage 4 chronic kidney disease, or unspecified chronic kidney disease: Secondary | ICD-10-CM | POA: Diagnosis not present

## 2015-01-23 DIAGNOSIS — N189 Chronic kidney disease, unspecified: Secondary | ICD-10-CM | POA: Diagnosis not present

## 2015-02-02 ENCOUNTER — Other Ambulatory Visit: Payer: Self-pay | Admitting: Nephrology

## 2015-02-02 DIAGNOSIS — N189 Chronic kidney disease, unspecified: Secondary | ICD-10-CM

## 2015-02-02 DIAGNOSIS — I10 Essential (primary) hypertension: Secondary | ICD-10-CM

## 2015-02-09 ENCOUNTER — Ambulatory Visit (HOSPITAL_COMMUNITY): Payer: Medicare Other | Attending: Cardiovascular Disease

## 2015-02-09 DIAGNOSIS — I2510 Atherosclerotic heart disease of native coronary artery without angina pectoris: Secondary | ICD-10-CM | POA: Diagnosis not present

## 2015-02-09 DIAGNOSIS — N189 Chronic kidney disease, unspecified: Secondary | ICD-10-CM | POA: Diagnosis not present

## 2015-02-09 DIAGNOSIS — Z794 Long term (current) use of insulin: Secondary | ICD-10-CM | POA: Diagnosis not present

## 2015-02-09 DIAGNOSIS — E119 Type 2 diabetes mellitus without complications: Secondary | ICD-10-CM | POA: Insufficient documentation

## 2015-02-09 DIAGNOSIS — I10 Essential (primary) hypertension: Secondary | ICD-10-CM | POA: Insufficient documentation

## 2015-02-09 DIAGNOSIS — E785 Hyperlipidemia, unspecified: Secondary | ICD-10-CM | POA: Diagnosis not present

## 2015-02-24 ENCOUNTER — Other Ambulatory Visit (HOSPITAL_BASED_OUTPATIENT_CLINIC_OR_DEPARTMENT_OTHER): Payer: Medicare Other

## 2015-02-24 DIAGNOSIS — E038 Other specified hypothyroidism: Secondary | ICD-10-CM

## 2015-02-24 DIAGNOSIS — R7303 Prediabetes: Secondary | ICD-10-CM

## 2015-02-24 DIAGNOSIS — C9290 Myeloid leukemia, unspecified, not having achieved remission: Secondary | ICD-10-CM | POA: Diagnosis not present

## 2015-02-24 DIAGNOSIS — C9210 Chronic myeloid leukemia, BCR/ABL-positive, not having achieved remission: Secondary | ICD-10-CM

## 2015-02-24 DIAGNOSIS — N184 Chronic kidney disease, stage 4 (severe): Secondary | ICD-10-CM

## 2015-02-24 DIAGNOSIS — D630 Anemia in neoplastic disease: Secondary | ICD-10-CM

## 2015-02-24 DIAGNOSIS — E1122 Type 2 diabetes mellitus with diabetic chronic kidney disease: Secondary | ICD-10-CM

## 2015-02-24 DIAGNOSIS — E785 Hyperlipidemia, unspecified: Secondary | ICD-10-CM

## 2015-02-24 DIAGNOSIS — H3530 Unspecified macular degeneration: Secondary | ICD-10-CM

## 2015-02-24 DIAGNOSIS — Z9861 Coronary angioplasty status: Secondary | ICD-10-CM

## 2015-02-24 DIAGNOSIS — I2510 Atherosclerotic heart disease of native coronary artery without angina pectoris: Secondary | ICD-10-CM

## 2015-02-24 LAB — CBC WITH DIFFERENTIAL/PLATELET
BASO%: 1.1 % (ref 0.0–2.0)
BASOS ABS: 0.1 10*3/uL (ref 0.0–0.1)
EOS%: 5.3 % (ref 0.0–7.0)
Eosinophils Absolute: 0.3 10*3/uL (ref 0.0–0.5)
HCT: 29.9 % — ABNORMAL LOW (ref 34.8–46.6)
HGB: 9.8 g/dL — ABNORMAL LOW (ref 11.6–15.9)
LYMPH%: 19.7 % (ref 14.0–49.7)
MCH: 26.6 pg (ref 25.1–34.0)
MCHC: 32.8 g/dL (ref 31.5–36.0)
MCV: 81.1 fL (ref 79.5–101.0)
MONO#: 0.7 10*3/uL (ref 0.1–0.9)
MONO%: 11.3 % (ref 0.0–14.0)
NEUT#: 4 10*3/uL (ref 1.5–6.5)
NEUT%: 62.6 % (ref 38.4–76.8)
Platelets: 151 10*3/uL (ref 145–400)
RBC: 3.69 10*6/uL — AB (ref 3.70–5.45)
RDW: 16.6 % — ABNORMAL HIGH (ref 11.2–14.5)
WBC: 6.4 10*3/uL (ref 3.9–10.3)
lymph#: 1.3 10*3/uL (ref 0.9–3.3)

## 2015-02-24 LAB — COMPREHENSIVE METABOLIC PANEL (CC13)
ALT: 22 U/L (ref 0–55)
ANION GAP: 9 meq/L (ref 3–11)
AST: 27 U/L (ref 5–34)
Albumin: 3.5 g/dL (ref 3.5–5.0)
Alkaline Phosphatase: 133 U/L (ref 40–150)
BUN: 22.7 mg/dL (ref 7.0–26.0)
CALCIUM: 8.1 mg/dL — AB (ref 8.4–10.4)
CO2: 21 mEq/L — ABNORMAL LOW (ref 22–29)
CREATININE: 2 mg/dL — AB (ref 0.6–1.1)
Chloride: 112 mEq/L — ABNORMAL HIGH (ref 98–109)
EGFR: 22 mL/min/{1.73_m2} — ABNORMAL LOW (ref >90)
Glucose: 57 mg/dl — ABNORMAL LOW (ref 70–140)
Potassium: 4.4 mEq/L (ref 3.5–5.1)
Sodium: 142 mEq/L (ref 136–145)
Total Bilirubin: 0.6 mg/dL (ref 0.20–1.20)
Total Protein: 6.9 g/dL (ref 6.4–8.3)

## 2015-03-02 ENCOUNTER — Telehealth: Payer: Self-pay | Admitting: Oncology

## 2015-03-02 NOTE — Telephone Encounter (Signed)
Patient son called and moved 6/7 appointment to 7/20. Other appointments remain the same. Son has new appointment date/time.

## 2015-03-03 ENCOUNTER — Ambulatory Visit: Payer: Medicare Other | Admitting: Oncology

## 2015-03-03 DIAGNOSIS — N189 Chronic kidney disease, unspecified: Secondary | ICD-10-CM | POA: Diagnosis not present

## 2015-03-03 DIAGNOSIS — I129 Hypertensive chronic kidney disease with stage 1 through stage 4 chronic kidney disease, or unspecified chronic kidney disease: Secondary | ICD-10-CM | POA: Diagnosis not present

## 2015-03-03 DIAGNOSIS — E785 Hyperlipidemia, unspecified: Secondary | ICD-10-CM | POA: Diagnosis not present

## 2015-03-03 DIAGNOSIS — E1129 Type 2 diabetes mellitus with other diabetic kidney complication: Secondary | ICD-10-CM | POA: Diagnosis not present

## 2015-03-26 DIAGNOSIS — H3531 Nonexudative age-related macular degeneration: Secondary | ICD-10-CM | POA: Diagnosis not present

## 2015-03-26 DIAGNOSIS — H34812 Central retinal vein occlusion, left eye: Secondary | ICD-10-CM | POA: Diagnosis not present

## 2015-03-26 DIAGNOSIS — H401423 Capsular glaucoma with pseudoexfoliation of lens, left eye, severe stage: Secondary | ICD-10-CM | POA: Diagnosis not present

## 2015-04-15 ENCOUNTER — Ambulatory Visit (HOSPITAL_BASED_OUTPATIENT_CLINIC_OR_DEPARTMENT_OTHER): Payer: Medicare Other | Admitting: Oncology

## 2015-04-15 VITALS — BP 171/64 | HR 64 | Temp 98.6°F | Resp 18 | Ht 62.0 in | Wt 123.5 lb

## 2015-04-15 DIAGNOSIS — C9210 Chronic myeloid leukemia, BCR/ABL-positive, not having achieved remission: Secondary | ICD-10-CM

## 2015-04-15 NOTE — Progress Notes (Signed)
[facility]  Telephone:(336) [phone] Fax:(336) [phone]     ID: Stacy Hendricks DOB: 06-Jul-1929  MR#: [id number]  ZLD#:[id number]  Patient Care Team: Crist Infante, MD as PCP - General (Internal Medicine) PCP: Jerlyn Ly, MD GYN: SU:  OTHER MD: Marisue Ivan MD (Felecia Jan, phone [phone])  CHIEF COMPLAINT: Chronic myeloid leukemia  CURRENT TREATMENT: Nilotinib   HISTORY OF PRESENT ILLNESS We do not have the records from her [facility] hematologist, but according to the patient: She has had a diagnosis of chronic myeloid leukemia for more than 10 years. She tells me she had "several treatments" which did not work (10 years ago we were using interferon, Hydrea, and oral chemotherapy agents) but that she tried imatinib initially, with no response. She was started on nilotinib "as soon as it came out" (which would've been late 2007) and has been on that medication with good control ever since.  INTERVAL HISTORY:  Stacy Hendricks returns today for follow-up of her chronic myeloid leukemia accompanied by her son [name]. Stacy Hendricks is tolerating the nilotinib without any side effects that she is aware of. So far they have been using a 2 years supply provided by her prior oncologist. Accordingly they don't know how much it will cost once they obtain the medication here. We just repeated a BCR.ABL probe and the results were not detectable.  Since the last visit here she also saw Jamal Maes in renal. She had some adjustments to her blood pressure medications and both the blood pressure and the renal function appeared to have improved. I gave all that information to glad so Stacy Hendricks can share it with her other physicians  REVIEW OF SYSTEMS: She looks much more positive and relaxed than the first time I saw her. She in other words looks less depressed. Asked what her worse problem is she says she has no worse problems. She left of the fact that her son has 76 and she tells me she hopes her favorite  political team wins the election. In short she has a lively mind. A detailed review of systems today was otherwise stable  PAST MEDICAL HISTORY: No past medical history on file.  Include macular degeneration, glaucoma, depression, diabetes mellitus type 2, coronary artery disease status post stenting of native vessel, hyperlipidemia, chronic kidney disease, and hypertension  PAST SURGICAL HISTORY: No past surgical history on file.  Status post appendectomy  FAMILY HISTORY No family history on file.  The patient's father died from a heart attack at the age of 67. The patient's mother died of "old age" at age 26 following a stroke. The patient is an only child.  GYNECOLOGIC HISTORY:  No LMP recorded.  Menarche age 26, first live birth age 54, she is [place] P1.   SOCIAL HISTORY:  Stacy Hendricks was born in [place]. 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 Their son, Stacy Hendricks"), moved to [place] 2 years ago and that is why the patient and her husband moved here from [place].   HEALTH MAINTENANCE: History  Substance Use Topics  . Smoking status: Not on file  . Smokeless tobacco: Not on file  . Alcohol Use: Not on file     Colonoscopy: Never  PAP: Remote  Bone density:  Lipid panel:  Mammography: Refuses  No Known Allergies  Current Outpatient Prescriptions  Medication Sig Dispense Refill  . carvedilol (COREG) 12.5 MG tablet Take 12.5 mg by mouth 2 (two) times daily with a meal.    .  clopidogrel (PLAVIX) 75 MG tablet     . COMBIGAN 0.2-0.5 % ophthalmic solution     . CRESTOR 10 MG tablet     . escitalopram (LEXAPRO) 10 MG tablet     . HUMALOG MIX 50/50 KWIKPEN (50-50) 100 UNIT/ML Kwikpen     . JANUVIA 25 MG tablet     . levothyroxine (SYNTHROID, LEVOTHROID) 50 MCG tablet     . lisinopril (PRINIVIL,ZESTRIL) 20 MG tablet     . LUMIGAN 0.01 % SOLN     . nilotinib (TASIGNA) 200 MG capsule Take 400 mg by mouth every 12 (twelve) hours.  Give on an empty stomach 1 hour before or 2 hours after meals.     No current facility-administered medications for this visit.    OBJECTIVE: Elderly white woman in no acute distress Filed Vitals:   04/15/15 1437  BP: 171/64  Pulse: 64  Temp: 98.6 F (37 C)  Resp: 18     Body mass index is 22.58 kg/(m^2).    ECOG FS:1 - Symptomatic but completely ambulatory  Sclerae unicteric, EOMs intact Oropharynx clear and moist No cervical or supraclavicular adenopathy Lungs no rales or rhonchi Heart regular rate and rhythm Abd soft, nontender, positive bowel sounds MSK no focal spinal tenderness Neuro: nonfocal, appears well oriented, appropriate affect Breasts: Deferred  LAB RESULTS:  Bcr.abl not detected in peripheral blood by quantitative RT-PCR 09/15/2014 or on repeat 02/24/2015  CMP     Component Value Date/Time   NA 142 02/24/2015 1510   K 4.4 02/24/2015 1510   CO2 21* 02/24/2015 1510   GLUCOSE 57* 02/24/2015 1510   BUN 22.7 02/24/2015 1510   CREATININE 2.0* 02/24/2015 1510   CALCIUM 8.1* 02/24/2015 1510   PROT 6.9 02/24/2015 1510   ALBUMIN 3.5 02/24/2015 1510   AST 27 02/24/2015 1510   ALT 22 02/24/2015 1510   ALKPHOS 133 02/24/2015 1510   BILITOT 0.60 02/24/2015 1510    INo results found for: SPEP, UPEP  Lab Results  Component Value Date   WBC 6.4 02/24/2015   NEUTROABS 4.0 02/24/2015   HGB 9.8* 02/24/2015   HCT 29.9* 02/24/2015   MCV 81.1 02/24/2015   PLT 151 02/24/2015      Chemistry      Component Value Date/Time   NA 142 02/24/2015 1510   K 4.4 02/24/2015 1510   CO2 21* 02/24/2015 1510   BUN 22.7 02/24/2015 1510   CREATININE 2.0* 02/24/2015 1510      Component Value Date/Time   CALCIUM 8.1* 02/24/2015 1510   ALKPHOS 133 02/24/2015 1510   AST 27 02/24/2015 1510   ALT 22 02/24/2015 1510   BILITOT 0.60 02/24/2015 1510       No results found for: LABCA2  No components found for: LABCA125  No results for input(s): INR in the last 168  hours.  Urinalysis No results found for: COLORURINE, APPEARANCEUR, LABSPEC, PHURINE, GLUCOSEU, HGBUR, BILIRUBINUR, KETONESUR, PROTEINUR, UROBILINOGEN, NITRITE, LEUKOCYTESUR  STUDIES: No results found.  ASSESSMENT: 81 y.o. [place] woman with a history of chronic myeloid leukemia, on nilotinib since 2007 with continuing good control  PLAN: Charna is doing quite well from a CML point of view and I'm making no changes in her treatment. She will continue on the nilotinib indefinitely. We did discuss some recent data that suggests in some patients it may be possible to discontinue treatment without recurrence and that if recurrence does develop the cancer would be again sensitive. At this point we are doing quite well  and I see no reason to attempts that.  We have repeatedly requested but never made able to obtain records from her [facility] oncologist, Dr Max Fickle ([facility]/ Dept. Hematology, [phone]).  We will repeat her lab work in December and she will see me again in January. They know to call for any problems that may develop before her next visit here.   Chauncey Cruel, MD   04/15/2015 3:06 PM Medical Oncology and Hematology [facility] [address] [place], [place] [address] Tel. [phone]    Fax. [phone]

## 2015-04-16 ENCOUNTER — Telehealth: Payer: Self-pay | Admitting: Oncology

## 2015-04-16 NOTE — Telephone Encounter (Signed)
Mailed calendar for appointment.

## 2015-05-07 DIAGNOSIS — H34812 Central retinal vein occlusion, left eye: Secondary | ICD-10-CM | POA: Diagnosis not present

## 2015-05-07 DIAGNOSIS — H43812 Vitreous degeneration, left eye: Secondary | ICD-10-CM | POA: Diagnosis not present

## 2015-05-26 ENCOUNTER — Other Ambulatory Visit: Payer: Self-pay | Admitting: *Deleted

## 2015-05-26 ENCOUNTER — Other Ambulatory Visit: Payer: Medicare Other

## 2015-06-30 ENCOUNTER — Other Ambulatory Visit: Payer: Self-pay | Admitting: *Deleted

## 2015-06-30 ENCOUNTER — Other Ambulatory Visit: Payer: Self-pay | Admitting: Oncology

## 2015-06-30 MED ORDER — NILOTINIB HCL 200 MG PO CAPS: 400.0000 mg | ORAL_CAPSULE | Freq: Two times a day (BID) | ORAL | Status: DC

## 2015-06-30 NOTE — Progress Notes (Signed)
Opened in error

## 2015-09-01 ENCOUNTER — Other Ambulatory Visit: Payer: Medicare Other

## 2015-09-03 DIAGNOSIS — H348122 Central retinal vein occlusion, left eye, stable: Secondary | ICD-10-CM | POA: Diagnosis not present

## 2015-09-03 DIAGNOSIS — H401431 Capsular glaucoma with pseudoexfoliation of lens, bilateral, mild stage: Secondary | ICD-10-CM | POA: Diagnosis not present

## 2015-09-03 DIAGNOSIS — H43813 Vitreous degeneration, bilateral: Secondary | ICD-10-CM | POA: Diagnosis not present

## 2015-09-07 DIAGNOSIS — E785 Hyperlipidemia, unspecified: Secondary | ICD-10-CM | POA: Diagnosis not present

## 2015-09-07 DIAGNOSIS — N189 Chronic kidney disease, unspecified: Secondary | ICD-10-CM | POA: Diagnosis not present

## 2015-09-07 DIAGNOSIS — E1129 Type 2 diabetes mellitus with other diabetic kidney complication: Secondary | ICD-10-CM | POA: Diagnosis not present

## 2015-09-07 DIAGNOSIS — C9210 Chronic myeloid leukemia, BCR/ABL-positive, not having achieved remission: Secondary | ICD-10-CM | POA: Diagnosis not present

## 2015-09-07 DIAGNOSIS — I129 Hypertensive chronic kidney disease with stage 1 through stage 4 chronic kidney disease, or unspecified chronic kidney disease: Secondary | ICD-10-CM | POA: Diagnosis not present

## 2015-09-13 NOTE — Progress Notes (Signed)
This encounter was created in error - please disregard.

## 2015-09-17 DIAGNOSIS — H401433 Capsular glaucoma with pseudoexfoliation of lens, bilateral, severe stage: Secondary | ICD-10-CM | POA: Diagnosis not present

## 2015-09-22 ENCOUNTER — Other Ambulatory Visit (HOSPITAL_BASED_OUTPATIENT_CLINIC_OR_DEPARTMENT_OTHER): Payer: Medicare Other

## 2015-09-22 DIAGNOSIS — D630 Anemia in neoplastic disease: Secondary | ICD-10-CM

## 2015-09-22 DIAGNOSIS — C9210 Chronic myeloid leukemia, BCR/ABL-positive, not having achieved remission: Secondary | ICD-10-CM | POA: Diagnosis not present

## 2015-09-22 DIAGNOSIS — Z9861 Coronary angioplasty status: Secondary | ICD-10-CM

## 2015-09-22 DIAGNOSIS — E1122 Type 2 diabetes mellitus with diabetic chronic kidney disease: Secondary | ICD-10-CM

## 2015-09-22 DIAGNOSIS — N184 Chronic kidney disease, stage 4 (severe): Secondary | ICD-10-CM

## 2015-09-22 DIAGNOSIS — E038 Other specified hypothyroidism: Secondary | ICD-10-CM

## 2015-09-22 DIAGNOSIS — E785 Hyperlipidemia, unspecified: Secondary | ICD-10-CM

## 2015-09-22 DIAGNOSIS — I2510 Atherosclerotic heart disease of native coronary artery without angina pectoris: Secondary | ICD-10-CM

## 2015-09-22 DIAGNOSIS — R7303 Prediabetes: Secondary | ICD-10-CM

## 2015-09-22 DIAGNOSIS — H3530 Unspecified macular degeneration: Secondary | ICD-10-CM

## 2015-09-22 LAB — COMPREHENSIVE METABOLIC PANEL
ALT: 16 U/L (ref 0–55)
AST: 18 U/L (ref 5–34)
Albumin: 3.4 g/dL — ABNORMAL LOW (ref 3.5–5.0)
Alkaline Phosphatase: 127 U/L (ref 40–150)
Anion Gap: 9 mEq/L (ref 3–11)
BUN: 22.1 mg/dL (ref 7.0–26.0)
CO2: 18 meq/L — AB (ref 22–29)
CREATININE: 2 mg/dL — AB (ref 0.6–1.1)
Calcium: 8.4 mg/dL (ref 8.4–10.4)
Chloride: 112 mEq/L — ABNORMAL HIGH (ref 98–109)
EGFR: 22 mL/min/{1.73_m2} — AB (ref >90)
GLUCOSE: 220 mg/dL — AB (ref 70–140)
Potassium: 4.2 mEq/L (ref 3.5–5.1)
SODIUM: 140 meq/L (ref 136–145)
TOTAL PROTEIN: 7 g/dL (ref 6.4–8.3)
Total Bilirubin: 1.06 mg/dL (ref 0.20–1.20)

## 2015-09-22 LAB — CBC WITH DIFFERENTIAL/PLATELET
BASO%: 1.1 % (ref 0.0–2.0)
Basophils Absolute: 0.1 10*3/uL (ref 0.0–0.1)
EOS%: 6.9 % (ref 0.0–7.0)
Eosinophils Absolute: 0.4 10*3/uL (ref 0.0–0.5)
HCT: 30.2 % — ABNORMAL LOW (ref 34.8–46.6)
HGB: 9.8 g/dL — ABNORMAL LOW (ref 11.6–15.9)
LYMPH%: 16.5 % (ref 14.0–49.7)
MCH: 26.3 pg (ref 25.1–34.0)
MCHC: 32.5 g/dL (ref 31.5–36.0)
MCV: 81.1 fL (ref 79.5–101.0)
MONO#: 0.7 10*3/uL (ref 0.1–0.9)
MONO%: 11.3 % (ref 0.0–14.0)
NEUT%: 64.2 % (ref 38.4–76.8)
NEUTROS ABS: 3.9 10*3/uL (ref 1.5–6.5)
Platelets: 163 10*3/uL (ref 145–400)
RBC: 3.73 10*6/uL (ref 3.70–5.45)
RDW: 17.3 % — ABNORMAL HIGH (ref 11.2–14.5)
WBC: 6.1 10*3/uL (ref 3.9–10.3)
lymph#: 1 10*3/uL (ref 0.9–3.3)

## 2015-10-14 ENCOUNTER — Ambulatory Visit (HOSPITAL_BASED_OUTPATIENT_CLINIC_OR_DEPARTMENT_OTHER): Payer: Medicare Other | Admitting: Oncology

## 2015-10-14 VITALS — BP 134/52 | HR 57 | Temp 97.6°F | Resp 18 | Ht 62.0 in | Wt 124.4 lb

## 2015-10-14 DIAGNOSIS — C9210 Chronic myeloid leukemia, BCR/ABL-positive, not having achieved remission: Secondary | ICD-10-CM

## 2015-10-14 DIAGNOSIS — E1122 Type 2 diabetes mellitus with diabetic chronic kidney disease: Secondary | ICD-10-CM

## 2015-10-14 DIAGNOSIS — N184 Chronic kidney disease, stage 4 (severe): Secondary | ICD-10-CM

## 2015-10-14 DIAGNOSIS — N183 Chronic kidney disease, stage 3 (moderate): Secondary | ICD-10-CM

## 2015-10-14 MED ORDER — NILOTINIB HCL 200 MG PO CAPS: 400.0000 mg | ORAL_CAPSULE | Freq: Two times a day (BID) | ORAL | Status: DC

## 2015-10-14 MED ORDER — SIMVASTATIN 10 MG PO TABS: 10.0000 mg | ORAL_TABLET | Freq: Every day | ORAL | Status: DC

## 2015-10-14 NOTE — Progress Notes (Signed)
[name]  Telephone:(336) [phone] Fax:(336) [phone]     ID: Stacy Hendricks DOB: 09/13/1929  MR#: [id number]  [id number]  Patient Care Team: Crist Infante, MD as PCP - General (Internal Medicine) PCP: Jerlyn Ly, MD GYN: SU:  OTHER MD: Marisue Ivan MD Felecia Jan, phone [phone]); Jamal Maes MD  CHIEF COMPLAINT: Chronic myeloid leukemia  CURRENT TREATMENT: Nilotinib   HISTORY OF PRESENT ILLNESS  From the original intake note:  We do not have the records from her [facility] hematologist, but according to the patient: She has had a diagnosis of chronic myeloid leukemia for more than 10 years. She tells me she had "several treatments" which did not work (10 years ago we were using interferon, Hydrea, and oral chemotherapy agents) but that she tried imatinib initially, with no response. She was started on nilotinib "as soon as it came out" (which would've been late 2007) and has been on that medication with good control ever since.  Her subsequent history is as detailed below  INTERVAL HISTORY:  Stacy Hendricks returns today for follow-up of her chronic myeloid leukemia accompanied by her son [name]. She continues on nilotinib which she tolerates with no side effects that she is aware of. She obtains a drug at no cost at this point. Repeat BCR ABL probes find undetectable levels of her chronic myeloid leukemia  REVIEW OF SYSTEMS: Stacy Hendricks is generally doing "good". When asked what her worse problem is she says she is weak and forgetful. She says this is "okay" because of her age (but her age is a secret she says). She dresses herself and does all her normal activities of daily living fairly independently. Surprisingly there have been no falls. She does not use a walker. She now is the proud order of one of her sons cats. Aside from these issuesa detailed review of systems today was stable  PAST MEDICAL HISTORY: No past medical history on file.  Include macular  degeneration, glaucoma, depression, diabetes mellitus type 2, coronary artery disease status post stenting of native vessel, hyperlipidemia, chronic kidney disease, and hypertension  PAST SURGICAL HISTORY: No past surgical history on file.  Status post appendectomy  FAMILY HISTORY No family history on file.  The patient's father died from a heart attack at the age of 47. The patient's mother died of "old age" at age 66 following a stroke. The patient is an only child.  GYNECOLOGIC HISTORY:  No LMP recorded.  Menarche age 38, first live birth age 62, she is [place] P1.   SOCIAL HISTORY:  Jakiya was born in [place]. 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 Their son, Stacy Hendricks"), moved to [place] 2 years ago and that is why the patient and her husband moved here from [place].   HEALTH MAINTENANCE: Social History  Substance Use Topics  . Smoking status: Not on file  . Smokeless tobacco: Not on file  . Alcohol Use: Not on file     Colonoscopy: Never  PAP: Remote  Bone density:  Lipid panel:  Mammography: Refuses  No Known Allergies  Current Outpatient Prescriptions  Medication Sig Dispense Refill  . carvedilol (COREG) 12.5 MG tablet Take 12.5 mg by mouth 2 (two) times daily with a meal.    . clopidogrel (PLAVIX) 75 MG tablet     . COMBIGAN 0.2-0.5 % ophthalmic solution     . CRESTOR 10 MG tablet     . escitalopram (LEXAPRO) 10 MG tablet     .  HUMALOG MIX 50/50 KWIKPEN (50-50) 100 UNIT/ML Kwikpen     . JANUVIA 25 MG tablet     . levothyroxine (SYNTHROID, LEVOTHROID) 50 MCG tablet     . lisinopril (PRINIVIL,ZESTRIL) 20 MG tablet     . LUMIGAN 0.01 % SOLN     . nilotinib (TASIGNA) 200 MG capsule Take 2 capsules (400 mg total) by mouth every 12 (twelve) hours. Give on an empty stomach 1 hour before or 2 hours after meals. 180 capsule 6   No current facility-administered medications for this visit.    OBJECTIVE: Elderly white  woman who appears stated age 82 Vitals:   10/14/15 1520  BP: 134/52  Pulse: 57  Temp: 97.6 F (36.4 C)  Resp: 18     Body mass index is 22.75 kg/(m^2).    ECOG FS:0 - Asymptomatic  Sclerae unicteric, bilateral arcus senilis, conjugate movements Oropharynx clear and slightly dry No cervical or supraclavicular adenopathy Lungs no rales or rhonchi Heart regular rate and rhythm Abd soft, nontender, positive bowel sounds, no palpable splenomegaly MSK no focal spinal tenderness Neuro: nonfocal, well oriented, appropriate affect Breasts: Deferred  LAB RESULTS:  Bcr.abl not detected in peripheral blood by quantitative RT-PCR 09/22/2015  CMP     Component Value Date/Time   NA 140 09/22/2015 1456   K 4.2 09/22/2015 1456   CO2 18* 09/22/2015 1456   GLUCOSE 220* 09/22/2015 1456   BUN 22.1 09/22/2015 1456   CREATININE 2.0* 09/22/2015 1456   CALCIUM 8.4 09/22/2015 1456   PROT 7.0 09/22/2015 1456   ALBUMIN 3.4* 09/22/2015 1456   AST 18 09/22/2015 1456   ALT 16 09/22/2015 1456   ALKPHOS 127 09/22/2015 1456   BILITOT 1.06 09/22/2015 1456    INo results found for: SPEP, UPEP  Lab Results  Component Value Date   WBC 6.1 09/22/2015   NEUTROABS 3.9 09/22/2015   HGB 9.8* 09/22/2015   HCT 30.2* 09/22/2015   MCV 81.1 09/22/2015   PLT 163 09/22/2015      Chemistry      Component Value Date/Time   NA 140 09/22/2015 1456   K 4.2 09/22/2015 1456   CO2 18* 09/22/2015 1456   BUN 22.1 09/22/2015 1456   CREATININE 2.0* 09/22/2015 1456      Component Value Date/Time   CALCIUM 8.4 09/22/2015 1456   ALKPHOS 127 09/22/2015 1456   AST 18 09/22/2015 1456   ALT 16 09/22/2015 1456   BILITOT 1.06 09/22/2015 1456       No results found for: LABCA2  No components found for: LABCA125  No results for input(s): INR in the last 168 hours.  Urinalysis No results found for: COLORURINE, APPEARANCEUR, LABSPEC, PHURINE, GLUCOSEU, HGBUR, BILIRUBINUR, KETONESUR, PROTEINUR, UROBILINOGEN,  NITRITE, LEUKOCYTESUR  STUDIES: No results found.  ASSESSMENT: 82 y.o. [place] woman with a history of chronic myeloid leukemia, on nilotinib since 2007 with continuing good control  PLAN: As far as her CML is concerned Stacy Hendricks is doing terrific and we are continuing with nilotinib as before.  Recent data suggests that in many cases where there has been a prolonged molecular response as in this case it is safe to stop the tyrosine kinase. There are some reports of cases remaining in remission 4 years off treatment. The data is relatively new, and since she is tolerating her treatment so well the prudent course I think is to make no change  They tell me her insurance has refused to pay for crest or are they requested an alternative.  I called in simvastatin 10 mg for her. I suspect they will pay for that. They will discuss with her primary care physician, Dr. Joylene Draft, whether he wishes to continue that medication or not when they see him at her next visit.  She will see me again in October. We will start follow-up here on a yearly basis from that point.  Chauncey Cruel, MD   10/14/2015 3:24 PM Medical Oncology and Hematology [facility] [address] [place], [place] [address] Tel. [phone]    Fax. [phone]

## 2015-10-15 ENCOUNTER — Telehealth: Payer: Self-pay | Admitting: Oncology

## 2015-10-15 NOTE — Telephone Encounter (Signed)
Called and left a message with oct 2017 appointments

## 2015-10-22 DIAGNOSIS — H401433 Capsular glaucoma with pseudoexfoliation of lens, bilateral, severe stage: Secondary | ICD-10-CM | POA: Diagnosis not present

## 2015-12-01 DIAGNOSIS — E559 Vitamin D deficiency, unspecified: Secondary | ICD-10-CM | POA: Diagnosis not present

## 2015-12-01 DIAGNOSIS — E784 Other hyperlipidemia: Secondary | ICD-10-CM | POA: Diagnosis not present

## 2015-12-01 DIAGNOSIS — N183 Chronic kidney disease, stage 3 (moderate): Secondary | ICD-10-CM | POA: Diagnosis not present

## 2015-12-01 DIAGNOSIS — E1122 Type 2 diabetes mellitus with diabetic chronic kidney disease: Secondary | ICD-10-CM | POA: Diagnosis not present

## 2015-12-08 DIAGNOSIS — E1122 Type 2 diabetes mellitus with diabetic chronic kidney disease: Secondary | ICD-10-CM | POA: Diagnosis not present

## 2015-12-08 DIAGNOSIS — I10 Essential (primary) hypertension: Secondary | ICD-10-CM | POA: Diagnosis not present

## 2015-12-08 DIAGNOSIS — H540 Blindness, both eyes: Secondary | ICD-10-CM | POA: Diagnosis not present

## 2015-12-08 DIAGNOSIS — E784 Other hyperlipidemia: Secondary | ICD-10-CM | POA: Diagnosis not present

## 2015-12-08 DIAGNOSIS — E559 Vitamin D deficiency, unspecified: Secondary | ICD-10-CM | POA: Diagnosis not present

## 2015-12-08 DIAGNOSIS — H4089 Other specified glaucoma: Secondary | ICD-10-CM | POA: Diagnosis not present

## 2015-12-08 DIAGNOSIS — Z1389 Encounter for screening for other disorder: Secondary | ICD-10-CM | POA: Diagnosis not present

## 2015-12-08 DIAGNOSIS — N183 Chronic kidney disease, stage 3 (moderate): Secondary | ICD-10-CM | POA: Diagnosis not present

## 2015-12-08 DIAGNOSIS — C92Z0 Other myeloid leukemia not having achieved remission: Secondary | ICD-10-CM | POA: Diagnosis not present

## 2015-12-08 DIAGNOSIS — I2510 Atherosclerotic heart disease of native coronary artery without angina pectoris: Secondary | ICD-10-CM | POA: Diagnosis not present

## 2015-12-08 DIAGNOSIS — Z6821 Body mass index (BMI) 21.0-21.9, adult: Secondary | ICD-10-CM | POA: Diagnosis not present

## 2015-12-08 DIAGNOSIS — Z0000 Encounter for general adult medical examination without abnormal findings: Secondary | ICD-10-CM | POA: Diagnosis not present

## 2015-12-09 MED FILL — *TASIGNA 200 MG CAPSULES: 200 | 28 days supply | Qty: 112 | Fill #1

## 2016-02-08 MED FILL — *TASIGNA 200 MG CAPSULES: 200 | 28 days supply | Qty: 112 | Fill #2

## 2016-03-09 DIAGNOSIS — N189 Chronic kidney disease, unspecified: Secondary | ICD-10-CM | POA: Diagnosis not present

## 2016-03-09 DIAGNOSIS — E1129 Type 2 diabetes mellitus with other diabetic kidney complication: Secondary | ICD-10-CM | POA: Diagnosis not present

## 2016-03-09 DIAGNOSIS — I129 Hypertensive chronic kidney disease with stage 1 through stage 4 chronic kidney disease, or unspecified chronic kidney disease: Secondary | ICD-10-CM | POA: Diagnosis not present

## 2016-03-09 DIAGNOSIS — E785 Hyperlipidemia, unspecified: Secondary | ICD-10-CM | POA: Diagnosis not present

## 2016-03-09 DIAGNOSIS — C9210 Chronic myeloid leukemia, BCR/ABL-positive, not having achieved remission: Secondary | ICD-10-CM | POA: Diagnosis not present

## 2016-04-18 MED FILL — *TASIGNA 200 MG CAPSULES: 200 | 28 days supply | Qty: 112 | Fill #3

## 2016-06-13 MED FILL — *TASIGNA 200 MG CAPSULES: 200 | 28 days supply | Qty: 112 | Fill #4

## 2016-06-30 ENCOUNTER — Other Ambulatory Visit (HOSPITAL_BASED_OUTPATIENT_CLINIC_OR_DEPARTMENT_OTHER): Payer: Medicare Other

## 2016-06-30 DIAGNOSIS — C9210 Chronic myeloid leukemia, BCR/ABL-positive, not having achieved remission: Secondary | ICD-10-CM

## 2016-06-30 DIAGNOSIS — N184 Chronic kidney disease, stage 4 (severe): Secondary | ICD-10-CM

## 2016-06-30 DIAGNOSIS — N183 Chronic kidney disease, stage 3 (moderate): Secondary | ICD-10-CM

## 2016-06-30 DIAGNOSIS — E1122 Type 2 diabetes mellitus with diabetic chronic kidney disease: Secondary | ICD-10-CM

## 2016-06-30 LAB — CBC WITH DIFFERENTIAL/PLATELET
BASO%: 1.2 % (ref 0.0–2.0)
Basophils Absolute: 0.1 10*3/uL (ref 0.0–0.1)
EOS ABS: 0.3 10*3/uL (ref 0.0–0.5)
EOS%: 6.2 % (ref 0.0–7.0)
HCT: 32.7 % — ABNORMAL LOW (ref 34.8–46.6)
HGB: 10.8 g/dL — ABNORMAL LOW (ref 11.6–15.9)
LYMPH%: 18.6 % (ref 14.0–49.7)
MCH: 27.5 pg (ref 25.1–34.0)
MCHC: 33.1 g/dL (ref 31.5–36.0)
MCV: 83.2 fL (ref 79.5–101.0)
MONO#: 0.5 10*3/uL (ref 0.1–0.9)
MONO%: 11.5 % (ref 0.0–14.0)
NEUT%: 62.5 % (ref 38.4–76.8)
NEUTROS ABS: 3 10*3/uL (ref 1.5–6.5)
PLATELETS: 147 10*3/uL (ref 145–400)
RBC: 3.93 10*6/uL (ref 3.70–5.45)
RDW: 15.7 % — ABNORMAL HIGH (ref 11.2–14.5)
WBC: 4.8 10*3/uL (ref 3.9–10.3)
lymph#: 0.9 10*3/uL (ref 0.9–3.3)

## 2016-06-30 LAB — COMPREHENSIVE METABOLIC PANEL
ALT: <9 U/L (ref 0–55)
ANION GAP: 7 meq/L (ref 3–11)
AST: 15 U/L (ref 5–34)
Albumin: 3.5 g/dL (ref 3.5–5.0)
Alkaline Phosphatase: 70 U/L (ref 40–150)
BILIRUBIN TOTAL: 0.5 mg/dL (ref 0.20–1.20)
BUN: 28.2 mg/dL — ABNORMAL HIGH (ref 7.0–26.0)
CHLORIDE: 115 meq/L — AB (ref 98–109)
CO2: 22 meq/L (ref 22–29)
Calcium: 8.8 mg/dL (ref 8.4–10.4)
Creatinine: 2.3 mg/dL — ABNORMAL HIGH (ref 0.6–1.1)
EGFR: 19 mL/min/{1.73_m2} — AB (ref >90)
GLUCOSE: 94 mg/dL (ref 70–140)
Potassium: 4.4 mEq/L (ref 3.5–5.1)
SODIUM: 144 meq/L (ref 136–145)
TOTAL PROTEIN: 7.3 g/dL (ref 6.4–8.3)

## 2016-07-14 ENCOUNTER — Ambulatory Visit (HOSPITAL_BASED_OUTPATIENT_CLINIC_OR_DEPARTMENT_OTHER): Payer: Medicare Other | Admitting: Oncology

## 2016-07-14 VITALS — BP 155/47 | HR 66 | Temp 98.5°F | Resp 18 | Ht 62.0 in | Wt 129.6 lb

## 2016-07-14 DIAGNOSIS — C9210 Chronic myeloid leukemia, BCR/ABL-positive, not having achieved remission: Secondary | ICD-10-CM

## 2016-07-14 MED ORDER — NILOTINIB HCL 200 MG PO CAPS: 400.0000 mg | ORAL_CAPSULE | Freq: Two times a day (BID) | ORAL | 6 refills | Status: DC

## 2016-07-14 NOTE — Progress Notes (Signed)
[facility]  Telephone:(336) [phone] Fax:(336) [phone]     ID: Stacy Hendricks DOB: [birth year]  MR#: [id number]  [id number]  Patient Care Team: Crist Infante, MD as PCP - General (Internal Medicine) PCP: Jerlyn Ly, MD GYN: SU:  OTHER MD: Marisue Ivan MD Felecia Jan, phone [phone]); Stacy Maes MD  CHIEF COMPLAINT: Chronic myeloid leukemia  CURRENT TREATMENT: Nilotinib   HISTORY OF PRESENT ILLNESS  From the original intake note:  We do not have the records from her [facility] hematologist, but according to the patient: She has had a diagnosis of chronic myeloid leukemia for more than 10 years. She tells me she had "several treatments" which did not work (10 years ago we were using interferon, Hydrea, and oral chemotherapy agents) but that she tried imatinib initially, with no response. She was started on nilotinib "as soon as it came out" (which would've been late 2007) and has been on that medication with good control ever since.  Her subsequent history is as detailed below  INTERVAL HISTORY:  Stacy Hendricks returns today for follow-up of her chronic myeloid leukemia accompanied by her son Stacy Hendricks. She takes nilotinib twice a day, as she has for many years. She reports no side effects from this medication. They're currently obtaining it free of charge.  REVIEW OF SYSTEMS: Stacy Hendricks has no complaints. [name] is having to leave the house, which is very difficult for her. She is essentially housebound. She denies pain, she denies fever, and she is not doing "anything" at home. She tells me there have been no recent falls but she is certainly high fall risk and she does not have a cane or a walker. She has not hooked up to services for the blind. A detailed review of systems today was otherwise stable  PAST MEDICAL HISTORY: No past medical history on file.  Include macular degeneration, glaucoma, depression, diabetes mellitus type 2, coronary artery disease status  post stenting of native vessel, hyperlipidemia, chronic kidney disease, and hypertension  PAST SURGICAL HISTORY: No past surgical history on file.  Status post appendectomy  FAMILY HISTORY No family history on file.  The patient's father died from a heart attack at the age of 10. The patient's mother died of "old age" at age 8 following a stroke. The patient is an only child.  GYNECOLOGIC HISTORY:  No LMP recorded.  Menarche age 54, first live birth age 77, she is [place] P1.   SOCIAL HISTORY:  Stacy Hendricks was born in [place]. She worked as a Licensed conveyancer. Her husband of 72 years, Stacy Hendricks, is a retired Chief Financial Officer. Their son, Stacy Hendricks"), moved to [place] 2 years ago and that is why the patient and her husband moved here from [place].   HEALTH MAINTENANCE: Social History  Substance Use Topics  . Smoking status: Not on file  . Smokeless tobacco: Not on file  . Alcohol use Not on file     Colonoscopy: Never  PAP: Remote  Bone density:  Lipid panel:  Mammography: Refuses  No Known Allergies  Current Outpatient Prescriptions  Medication Sig Dispense Refill  . carvedilol (COREG) 12.5 MG tablet Take 12.5 mg by mouth 2 (two) times daily with a meal.    . COMBIGAN 0.2-0.5 % ophthalmic solution     . escitalopram (LEXAPRO) 10 MG tablet     . HUMALOG MIX 50/50 KWIKPEN (50-50) 100 UNIT/ML Kwikpen     . JANUVIA 25 MG tablet     . levothyroxine (SYNTHROID, LEVOTHROID) 50 MCG tablet     .  lisinopril (PRINIVIL,ZESTRIL) 20 MG tablet     . LUMIGAN 0.01 % SOLN     . nilotinib (TASIGNA) 200 MG capsule Take 2 capsules (400 mg total) by mouth every 12 (twelve) hours. Give on an empty stomach 1 hour before or 2 hours after meals. 180 capsule 6  . simvastatin (ZOCOR) 10 MG tablet Take 1 tablet (10 mg total) by mouth daily. 90 tablet 4   No current facility-administered medications for this visit.     OBJECTIVE: Elderly white woman Who is blind Vitals:   07/14/16 1559  BP: (!) 155/47    Pulse: 66  Resp: 18  Temp: 98.5 F (36.9 C)     Body mass index is 23.7 kg/m.    ECOG FS:0 - Asymptomatic  Sclerae unicteric Oropharynx clear, slightly dry No cervical or supraclavicular adenopathy Lungs no rales or rhonchi Heart regular rate and rhythm Abd soft, nontender, positive bowel sounds MSK no focal spinal tenderness Neuro: nonfocal, well oriented, frustrated affect Breasts: Deferred    LAB RESULTS:  Bcr.abl not detected in peripheral blood by quantitative RT-PCR 09/22/2015 or 06/30/2016  CMP     Component Value Date/Time   NA 144 06/30/2016 1519   K 4.4 06/30/2016 1519   CO2 22 06/30/2016 1519   GLUCOSE 94 06/30/2016 1519   BUN 28.2 (H) 06/30/2016 1519   CREATININE 2.3 (H) 06/30/2016 1519   CALCIUM 8.8 06/30/2016 1519   PROT 7.3 06/30/2016 1519   ALBUMIN 3.5 06/30/2016 1519   AST 15 06/30/2016 1519   ALT <9 06/30/2016 1519   ALKPHOS 70 06/30/2016 1519   BILITOT 0.50 06/30/2016 1519    INo results found for: SPEP, UPEP  Lab Results  Component Value Date   WBC 4.8 06/30/2016   NEUTROABS 3.0 06/30/2016   HGB 10.8 (L) 06/30/2016   HCT 32.7 (L) 06/30/2016   MCV 83.2 06/30/2016   PLT 147 06/30/2016      Chemistry      Component Value Date/Time   NA 144 06/30/2016 1519   K 4.4 06/30/2016 1519   CO2 22 06/30/2016 1519   BUN 28.2 (H) 06/30/2016 1519   CREATININE 2.3 (H) 06/30/2016 1519      Component Value Date/Time   CALCIUM 8.8 06/30/2016 1519   ALKPHOS 70 06/30/2016 1519   AST 15 06/30/2016 1519   ALT <9 06/30/2016 1519   BILITOT 0.50 06/30/2016 1519       No results found for: LABCA2  No components found for: LABCA125  No results for input(s): INR in the last 168 hours.  Urinalysis No results found for: COLORURINE, APPEARANCEUR, LABSPEC, PHURINE, GLUCOSEU, HGBUR, BILIRUBINUR, KETONESUR, PROTEINUR, UROBILINOGEN, NITRITE, LEUKOCYTESUR  STUDIES: No results found.  ASSESSMENT: 82 y.o. [place] woman with a history of chronic  myeloid leukemia, on nilotinib since 2007 with continuing good control  PLAN: Stacy Hendricks has no evidence of disease activity at this point. This is very favorable.  Patients who have a major molecular response that is lasting such as hers can sometimes be taken off their medication and observed. We discussed this today.  The problem is that observation is difficult for her. We would want to draw her blood 3 or 4 times a year. It is very difficult for her to get out of the house for any reason including physician visits. Also, she really has no side effects from this medication, which she is used to taking for many years.  After much discussion both she and her son prefer to continue on  the nilotinib and not check the labs quite so frequently.  I think she is high fall risk and likely needs at least walking steak or perhaps a walker. She also is not hooked up with services to the blind. I'm going to see if her social workers can help with that.  If there were a way that we could have her blood drawn at her house and brought here for processing, that would be the very best for them  Tentatively I am setting her up or a visit here in one year. She knows to call for any problems that may develop before then.  Chauncey Cruel, MD   07/14/2016 4:35 PM Medical Oncology and Hematology [facility] [address] [place], [place] [address] Tel. [phone]    Fax. [phone]

## 2016-07-15 ENCOUNTER — Other Ambulatory Visit: Payer: Self-pay | Admitting: *Deleted

## 2016-08-04 MED FILL — TASIGNA 200 MG CAPSULE: 200 | 28 days supply | Qty: 112 | Fill #0

## 2016-10-10 MED FILL — TASIGNA 200 MG CAPSULE: 200 | 28 days supply | Qty: 112 | Fill #1

## 2016-11-10 ENCOUNTER — Other Ambulatory Visit: Payer: Self-pay | Admitting: Oncology

## 2017-01-09 DIAGNOSIS — I2510 Atherosclerotic heart disease of native coronary artery without angina pectoris: Secondary | ICD-10-CM | POA: Diagnosis not present

## 2017-01-09 DIAGNOSIS — E559 Vitamin D deficiency, unspecified: Secondary | ICD-10-CM | POA: Diagnosis not present

## 2017-01-09 DIAGNOSIS — N183 Chronic kidney disease, stage 3 (moderate): Secondary | ICD-10-CM | POA: Diagnosis not present

## 2017-01-09 DIAGNOSIS — H548 Legal blindness, as defined in USA: Secondary | ICD-10-CM | POA: Diagnosis not present

## 2017-01-09 DIAGNOSIS — E1121 Type 2 diabetes mellitus with diabetic nephropathy: Secondary | ICD-10-CM | POA: Diagnosis not present

## 2017-01-09 DIAGNOSIS — C92Z0 Other myeloid leukemia not having achieved remission: Secondary | ICD-10-CM | POA: Diagnosis not present

## 2017-01-09 DIAGNOSIS — E784 Other hyperlipidemia: Secondary | ICD-10-CM | POA: Diagnosis not present

## 2017-01-09 DIAGNOSIS — H4089 Other specified glaucoma: Secondary | ICD-10-CM | POA: Diagnosis not present

## 2017-01-09 DIAGNOSIS — Z0000 Encounter for general adult medical examination without abnormal findings: Secondary | ICD-10-CM | POA: Diagnosis not present

## 2017-01-09 DIAGNOSIS — Z1389 Encounter for screening for other disorder: Secondary | ICD-10-CM | POA: Diagnosis not present

## 2017-01-09 DIAGNOSIS — Z6824 Body mass index (BMI) 24.0-24.9, adult: Secondary | ICD-10-CM | POA: Diagnosis not present

## 2017-01-09 DIAGNOSIS — I10 Essential (primary) hypertension: Secondary | ICD-10-CM | POA: Diagnosis not present

## 2017-02-03 ENCOUNTER — Other Ambulatory Visit: Payer: Self-pay | Admitting: Oncology

## 2017-04-28 ENCOUNTER — Other Ambulatory Visit: Payer: Self-pay | Admitting: Oncology

## 2017-07-12 ENCOUNTER — Other Ambulatory Visit: Payer: Self-pay

## 2017-07-12 DIAGNOSIS — C9210 Chronic myeloid leukemia, BCR/ABL-positive, not having achieved remission: Secondary | ICD-10-CM

## 2017-07-12 NOTE — Progress Notes (Signed)
[facility]  Telephone:(336) [phone] Fax:(336) [phone]     ID: Stacy Hendricks DOB: [birth year]  MR#: [id number]  [id number]#:[id number]  Patient Care Team: Crist Infante, MD as PCP - General (Internal Medicine) PCP: Crist Infante, MD GYN: SU:  OTHER MD: Marisue Ivan MD Felecia Jan, phone [phone]); Jamal Maes MD  CHIEF COMPLAINT: Chronic myeloid leukemia  CURRENT TREATMENT: [Nilotinib]   HISTORY OF PRESENT ILLNESS  From the original intake note:  We do not have the records from her [facility] hematologist, but according to the patient: She has had a diagnosis of chronic myeloid leukemia for more than 10 years. She tells me she had "several treatments" which did not work (10 years ago we were using interferon, Hydrea, and oral chemotherapy agents) but that she tried imatinib initially, with no response. She was started on nilotinib "as soon as it came out" (which would've been late 2007) and has been on that medication with good control ever since.  Her subsequent history is as detailed below  INTERVAL HISTORY:  Stacy Hendricks returns today for follow-up of her chronic myelogenous leukemia accompanied by her son Stacy Hendricks. She is doing well overall without any pain. Her son notes that she stopped taking Nilotinib in March 2018 due to increased dry mouth. Following discontinuation of Nilotinib, she noted that her dry mouth symptoms improved.     REVIEW OF SYSTEMS: Stacy Hendricks reports no complaints at this time. She has not attempted to start services for the blind. She denies unusual headaches, visual changes, nausea, vomiting, or dizziness. There has been no unusual cough, phlegm production, or pleurisy. This been no change in bowel or bladder habits. She denies unexplained fatigue or unexplained weight loss, bleeding, rash, or fever. A detailed review of systems was otherwise stable.    PAST MEDICAL HISTORY: No past medical history on file.  Include macular degeneration, glaucoma,  depression, diabetes mellitus type 2, coronary artery disease status post stenting of native vessel, hyperlipidemia, chronic kidney disease, and hypertension  PAST SURGICAL HISTORY: No past surgical history on file.  Status post appendectomy  FAMILY HISTORY No family history on file.  The patient's father died from a heart attack at the age of 31. The patient's mother died of "old age" at age 84 following a stroke. The patient is an only child.  GYNECOLOGIC HISTORY:  No LMP recorded.  Menarche age 75, first live birth age 64, she is Stacy Hendricks P1.   SOCIAL HISTORY:  Stacy Hendricks was born in [place]. 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 Their son, Stacy Hendricks"), moved to [place] 2 years ago and that is why the patient and her husband moved here from [place].   HEALTH MAINTENANCE: Social History  Substance Use Topics  . Smoking status: Not on file  . Smokeless tobacco: Not on file  . Alcohol use Not on file     Colonoscopy: Never  PAP: Remote  Bone density:  Lipid panel:  Mammography: Refuses  No Known Allergies  Current Outpatient Prescriptions  Medication Sig Dispense Refill  . carvedilol (COREG) 12.5 MG tablet Take 12.5 mg by mouth 2 (two) times daily with a meal.    . COMBIGAN 0.2-0.5 % ophthalmic solution     . escitalopram (LEXAPRO) 10 MG tablet     . HUMALOG MIX 50/50 KWIKPEN (50-50) 100 UNIT/ML Kwikpen     . JANUVIA 25 MG tablet     . levothyroxine (SYNTHROID, LEVOTHROID) 50 MCG tablet     .  lisinopril (PRINIVIL,ZESTRIL) 20 MG tablet     . LUMIGAN 0.01 % SOLN     . nilotinib (TASIGNA) 200 MG capsule Take 2 capsules (400 mg total) by mouth every 12 (twelve) hours. Give on an empty stomach 1 hour before or 2 hours after meals. 180 capsule 6  . simvastatin (ZOCOR) 10 MG tablet TAKE 1 TABLET(10 MG) BY MOUTH DAILY 90 tablet 0   No current facility-administered medications for this visit.     OBJECTIVE: Elderly white woman  Who appears stated age 83:   07/13/17 1417  BP: (!) 168/74  Pulse: 66  Resp: 20  Temp: 97.9 F (36.6 C)  SpO2: 99%     Body mass index is 24.23 kg/m.    ECOG FS:1 - Symptomatic but completely ambulatory  Sclerae unicteric Oropharynx clear and moist No cervical or supraclavicular adenopathy Lungs no rales or rhonchi Heart regular rate and rhythm Abd soft, nontender, positive bowel sounds MSK no focal spinal tenderness, no upper extremity lymphedema Neuro: nonfocal, well oriented, appropriate affect Breasts: I do not palpate a mass in either breast. Both axillae are benign.    LAB RESULTS:  Bcr.abl not detected in peripheral blood by quantitative RT-PCR 09/22/2015 or 06/30/2016  CMP     Component Value Date/Time   NA 141 07/13/2017 1331   K 4.1 07/13/2017 1331   CO2 23 07/13/2017 1331   GLUCOSE 250 (H) 07/13/2017 1331   BUN 21.0 07/13/2017 1331   CREATININE 2.1 (H) 07/13/2017 1331   CALCIUM 9.1 07/13/2017 1331   PROT 7.1 07/13/2017 1331   ALBUMIN 3.7 07/13/2017 1331   AST 14 07/13/2017 1331   ALT 6 07/13/2017 1331   ALKPHOS 68 07/13/2017 1331   BILITOT 0.42 07/13/2017 1331    INo results found for: SPEP, UPEP  Lab Results  Component Value Date   WBC 5.9 07/13/2017   NEUTROABS 3.5 07/13/2017   HGB 11.5 (L) 07/13/2017   HCT 34.7 (L) 07/13/2017   MCV 85.9 07/13/2017   PLT 139 (L) 07/13/2017      Chemistry      Component Value Date/Time   NA 141 07/13/2017 1331   K 4.1 07/13/2017 1331   CO2 23 07/13/2017 1331   BUN 21.0 07/13/2017 1331   CREATININE 2.1 (H) 07/13/2017 1331      Component Value Date/Time   CALCIUM 9.1 07/13/2017 1331   ALKPHOS 68 07/13/2017 1331   AST 14 07/13/2017 1331   ALT 6 07/13/2017 1331   BILITOT 0.42 07/13/2017 1331       No results found for: LABCA2  No components found for: LABCA125  No results for input(s): INR in the last 168 hours.  Urinalysis No results found for: COLORURINE, APPEARANCEUR, LABSPEC, PHURINE,  GLUCOSEU, HGBUR, BILIRUBINUR, KETONESUR, PROTEINUR, UROBILINOGEN, NITRITE, LEUKOCYTESUR  STUDIES: No results found.  ASSESSMENT: 83 y.o. [place] woman with a history of chronic myeloid leukemia, on nilotinib since 2007 with continuing good control  PLAN: Stacy Hendricks Took herself off nilotinib as of March 2018. She feels better, has less mouth dryness, and her hemoglobin is also slightly improved.  We have checked to be CR.ABL today. I am hopeful that we'll continue to be "undetectable".  We discussed the fact that patients who have complete responses that are prolonged, such as Stacy Hendricks, may undergo a trial of stopping tyrosine kinase inhibitors. About half the time the tumor does not recur. The other half with time it does recur.  Normally I would recheck Stacy Hendricks's counts every 3 months but  transportation is very difficult for her.  What we are dated is simply discussed the symptoms that would make them want to bring Stacy Hendricks back sooner than a year from now. These include bruising bleeding and fever in particular but also significant weight loss and significant fatigue  Otherwise assuming allthosethingsoccurandthatthelabsdrawntodayremainednegative,shewillseemeagainayearfromnow  Theyknowtocallforotherissuesaswellasneeded.  Magrinat, Virgie Dad, MD  07/13/17 2:40 PM Medical Oncology and Hematology [facility] [address] [place], [place] [address] Tel. [phone]    Fax. [phone]  This document serves as a record of services personally performed by Lurline Del, MD. It was created on her behalf by Steva Colder, a trained medical scribe. The creation of this record is based on the scribe's personal observations and the provider's statements to them. This document has been checked and approved by the attending provider.

## 2017-07-13 ENCOUNTER — Other Ambulatory Visit (HOSPITAL_BASED_OUTPATIENT_CLINIC_OR_DEPARTMENT_OTHER): Payer: Medicare Other

## 2017-07-13 ENCOUNTER — Ambulatory Visit (HOSPITAL_BASED_OUTPATIENT_CLINIC_OR_DEPARTMENT_OTHER): Payer: Medicare Other | Admitting: Oncology

## 2017-07-13 VITALS — BP 168/74 | HR 66 | Temp 97.9°F | Resp 20 | Ht 62.0 in | Wt 132.5 lb

## 2017-07-13 DIAGNOSIS — C9210 Chronic myeloid leukemia, BCR/ABL-positive, not having achieved remission: Secondary | ICD-10-CM

## 2017-07-13 LAB — COMPREHENSIVE METABOLIC PANEL
ALK PHOS: 68 U/L (ref 40–150)
ALT: 6 U/L (ref 0–55)
ANION GAP: 12 meq/L — AB (ref 3–11)
AST: 14 U/L (ref 5–34)
Albumin: 3.7 g/dL (ref 3.5–5.0)
BILIRUBIN TOTAL: 0.42 mg/dL (ref 0.20–1.20)
BUN: 21 mg/dL (ref 7.0–26.0)
CALCIUM: 9.1 mg/dL (ref 8.4–10.4)
CO2: 23 mEq/L (ref 22–29)
Chloride: 107 mEq/L (ref 98–109)
Creatinine: 2.1 mg/dL — ABNORMAL HIGH (ref 0.6–1.1)
EGFR: 21 mL/min/{1.73_m2} — ABNORMAL LOW (ref >60)
Glucose: 250 mg/dl — ABNORMAL HIGH (ref 70–140)
Potassium: 4.1 mEq/L (ref 3.5–5.1)
Sodium: 141 mEq/L (ref 136–145)
Total Protein: 7.1 g/dL (ref 6.4–8.3)

## 2017-07-13 LAB — CBC WITH DIFFERENTIAL/PLATELET
BASO%: 1.6 % (ref 0.0–2.0)
Basophils Absolute: 0.1 10*3/uL (ref 0.0–0.1)
EOS ABS: 0.5 10*3/uL (ref 0.0–0.5)
EOS%: 7.8 % — ABNORMAL HIGH (ref 0.0–7.0)
HEMATOCRIT: 34.7 % — AB (ref 34.8–46.6)
HGB: 11.5 g/dL — ABNORMAL LOW (ref 11.6–15.9)
LYMPH%: 19.3 % (ref 14.0–49.7)
MCH: 28.4 pg (ref 25.1–34.0)
MCHC: 33.1 g/dL (ref 31.5–36.0)
MCV: 85.9 fL (ref 79.5–101.0)
MONO#: 0.7 10*3/uL (ref 0.1–0.9)
MONO%: 11.8 % (ref 0.0–14.0)
NEUT%: 59.5 % (ref 38.4–76.8)
NEUTROS ABS: 3.5 10*3/uL (ref 1.5–6.5)
Platelets: 139 10*3/uL — ABNORMAL LOW (ref 145–400)
RBC: 4.04 10*6/uL (ref 3.70–5.45)
RDW: 17.9 % — ABNORMAL HIGH (ref 11.2–14.5)
WBC: 5.9 10*3/uL (ref 3.9–10.3)
lymph#: 1.1 10*3/uL (ref 0.9–3.3)

## 2017-07-14 ENCOUNTER — Telehealth: Payer: Self-pay | Admitting: Oncology

## 2017-07-14 NOTE — Telephone Encounter (Signed)
Called patient regarding October 2019 appointment. I will mail a letter.

## 2017-07-21 DIAGNOSIS — I129 Hypertensive chronic kidney disease with stage 1 through stage 4 chronic kidney disease, or unspecified chronic kidney disease: Secondary | ICD-10-CM | POA: Diagnosis not present

## 2017-07-21 DIAGNOSIS — E785 Hyperlipidemia, unspecified: Secondary | ICD-10-CM | POA: Diagnosis not present

## 2017-07-21 DIAGNOSIS — N189 Chronic kidney disease, unspecified: Secondary | ICD-10-CM | POA: Diagnosis not present

## 2017-07-21 DIAGNOSIS — C9210 Chronic myeloid leukemia, BCR/ABL-positive, not having achieved remission: Secondary | ICD-10-CM | POA: Diagnosis not present

## 2017-07-21 DIAGNOSIS — E1129 Type 2 diabetes mellitus with other diabetic kidney complication: Secondary | ICD-10-CM | POA: Diagnosis not present

## 2017-07-23 ENCOUNTER — Other Ambulatory Visit: Payer: Self-pay | Admitting: Oncology

## 2017-07-24 ENCOUNTER — Other Ambulatory Visit: Payer: Self-pay | Admitting: Oncology

## 2017-07-27 ENCOUNTER — Encounter: Payer: Self-pay | Admitting: Oncology

## 2017-07-27 ENCOUNTER — Other Ambulatory Visit: Payer: Self-pay | Admitting: Oncology

## 2017-07-27 MED ORDER — NILOTINIB HCL 200 MG PO CAPS: 400.0000 mg | ORAL_CAPSULE | Freq: Two times a day (BID) | ORAL | 12 refills | Status: DC

## 2017-08-02 ENCOUNTER — Telehealth: Payer: Self-pay

## 2017-08-02 ENCOUNTER — Other Ambulatory Visit: Payer: Self-pay

## 2017-08-02 ENCOUNTER — Telehealth: Payer: Self-pay | Admitting: Pharmacist

## 2017-08-02 DIAGNOSIS — C9210 Chronic myeloid leukemia, BCR/ABL-positive, not having achieved remission: Secondary | ICD-10-CM

## 2017-08-02 MED ORDER — NILOTINIB HCL 200 MG PO CAPS: 400.0000 mg | ORAL_CAPSULE | Freq: Two times a day (BID) | ORAL | 12 refills | Status: DC

## 2017-08-02 MED FILL — TASIGNA 200 MG CAPSULE: 200 | 28 days supply | Qty: 112 | Fill #0

## 2017-08-02 NOTE — Telephone Encounter (Signed)
Oral Oncology Pharmacist Encounter  Received new prescription for Tasigna (nilotinib) for the treatment of CML, planned duration until disease progression or unacceptable toxicity. Patient stable on Tasigna previously, discontinued due to reported dry mouth, now to be restarted at same dose due to increasing bcr/abl  Labs from 07/13/17 assessed, OK for treatment. Noted SCr=2.1, est CrCl~31 mL/min, no dose adjustments needed for renal insufficiency per manufacturer  No QTc in Epic to assess. Manufacturer recommendations to assess QTc and Tasigna initiation, 7 days after initiation, and then periodically. This will be discussed with MD. Electrolytes (including magnesiium) will be closely monitored.  Current medication list in Epic reviewed, no significant DDIs with Tasigna identified.  Prescription has been e-scribed to the [facility] for benefits analysis and approval. Copayment is $0, patient's son, [name], will pick it up from the pharmacy after 2pm on 08/03/17.   I spoke with Vlad for overview of: Tasigna (nilotinib).  Counseled on administration, dosing, side effects, monitoring, drug-food interactions, safe handling, storage, and disposal.  Patient will take Tasigna 200mg  capsules, 2 capsules (400mg ) by mouth twice daily, with doses taken ~12 hours apart.  Patient will take Tasigna on an empty stomach, at least 1 hour before or 2 hours after meals.  Swallow capsules whole with water.  Patient knows to avoid grapefruit and grapefruit juice. Tasigna re-start date: 08/04/17  Side effects include but not limited to: fatigue, night sweats, N/V/D, rash, arthralgias, altered cardiac conduction, and decreased blood counts. Discussed previous issues with dry mouth and some OTC measures for prevention/management.    Reviewed with patient importance of keeping a medication schedule and plan for any missed doses.  Vlad voiced understanding and appreciation.   All  questions answered. Medication reconciliation performed and medication/allergy list updated.  Vladt knows to call the office with questions or concerns. Oral Oncology Clinic will continue to follow.  Thank you,  Johny Drilling, PharmD, BCPS, BCOP 08/02/2017  4:52 PM Oral Oncology Clinic [phone]

## 2017-08-02 NOTE — Telephone Encounter (Signed)
Spoke with pt's son, [name], by phone per Dr Virgie Dad request.   Explained to Vlad that pt's CML is still active and she needs to be taking her nilotinib regularly.  Informed him script has been sent to [facility].  Vlad request script be sent to [facility]. Vlad verbalizes understanding that his mother needs to take the medication as prescribed and that he can pick it up tomorrow from [facility].

## 2017-09-20 MED FILL — TASIGNA 200 MG CAPSULE: 200 | 28 days supply | Qty: 112 | Fill #1

## 2017-09-27 ENCOUNTER — Telehealth: Payer: Self-pay | Admitting: Oncology

## 2017-09-27 NOTE — Telephone Encounter (Signed)
Scheduled appt per 12/30 sch message - patients son is aware of appt date and time. - Reminder letter sent in the mail.

## 2017-09-28 ENCOUNTER — Other Ambulatory Visit: Payer: Self-pay | Admitting: Oncology

## 2017-09-28 NOTE — Progress Notes (Signed)
I received a note from our oral chemotherapy pharmacist that the patient may be taking half the 2 segment dose assigned.  [place] note and we will be checking her lab work in April.  They know to call for any other issues that may develop.

## 2017-10-19 ENCOUNTER — Other Ambulatory Visit: Payer: Self-pay | Admitting: Oncology

## 2017-10-24 MED FILL — TASIGNA 200 MG CAPSULE: 200 | 28 days supply | Qty: 112 | Fill #2

## 2017-11-22 MED FILL — TASIGNA 200 MG CAPSULE: 200 | 28 days supply | Qty: 112 | Fill #3

## 2017-12-18 MED FILL — TASIGNA 200 MG CAPSULE: 200 | 28 days supply | Qty: 112 | Fill #4

## 2017-12-26 ENCOUNTER — Inpatient Hospital Stay: Payer: Medicare Other | Attending: Oncology

## 2017-12-26 DIAGNOSIS — C9210 Chronic myeloid leukemia, BCR/ABL-positive, not having achieved remission: Secondary | ICD-10-CM | POA: Insufficient documentation

## 2017-12-26 LAB — COMPREHENSIVE METABOLIC PANEL
ALK PHOS: 71 U/L (ref 40–150)
ALT: 8 U/L (ref 0–55)
ANION GAP: 11 (ref 3–11)
AST: 12 U/L (ref 5–34)
Albumin: 3.7 g/dL (ref 3.5–5.0)
BUN: 21 mg/dL (ref 7–26)
CALCIUM: 9.6 mg/dL (ref 8.4–10.4)
CHLORIDE: 106 mmol/L (ref 98–109)
CO2: 21 mmol/L — ABNORMAL LOW (ref 22–29)
CREATININE: 2.17 mg/dL — AB (ref 0.60–1.10)
GFR calc Af Amer: 23 mL/min — ABNORMAL LOW (ref >60)
GFR calc non Af Amer: 19 mL/min — ABNORMAL LOW (ref >60)
GLUCOSE: 297 mg/dL — AB (ref 70–140)
Potassium: 4 mmol/L (ref 3.5–5.1)
SODIUM: 138 mmol/L (ref 136–145)
Total Bilirubin: 0.3 mg/dL (ref 0.2–1.2)
Total Protein: 7.1 g/dL (ref 6.4–8.3)

## 2017-12-26 LAB — CBC WITH DIFFERENTIAL/PLATELET
Basophils Absolute: 0.2 10*3/uL — ABNORMAL HIGH (ref 0.0–0.1)
Basophils Relative: 2 %
EOS ABS: 0.3 10*3/uL (ref 0.0–0.5)
Eosinophils Relative: 5 %
HCT: 36.3 % (ref 34.8–46.6)
HEMOGLOBIN: 12 g/dL (ref 11.6–15.9)
LYMPHS PCT: 14 %
Lymphs Abs: 0.9 10*3/uL (ref 0.9–3.3)
MCH: 28.9 pg (ref 25.1–34.0)
MCHC: 32.9 g/dL (ref 31.5–36.0)
MCV: 87.8 fL (ref 79.5–101.0)
MONOS PCT: 11 %
Monocytes Absolute: 0.8 10*3/uL (ref 0.1–0.9)
NEUTROS PCT: 68 %
Neutro Abs: 4.7 10*3/uL (ref 1.5–6.5)
Platelets: 137 10*3/uL — ABNORMAL LOW (ref 145–400)
RBC: 4.13 MIL/uL (ref 3.70–5.45)
RDW: 16.2 % — ABNORMAL HIGH (ref 11.2–14.5)
WBC: 6.8 10*3/uL (ref 3.9–10.3)

## 2017-12-26 LAB — MAGNESIUM: Magnesium: 2.3 mg/dL (ref 1.5–2.5)

## 2018-01-07 ENCOUNTER — Other Ambulatory Visit: Payer: Self-pay | Admitting: Oncology

## 2018-01-07 NOTE — Progress Notes (Unsigned)
Stacy Hendricks has ratio has increased from about 13 to about 16%.  I do not know if she is taking her nilotinib as she is supposed to.  I have called her son and left him a message so he can confirm.

## 2018-01-11 LAB — BCR/ABL

## 2018-02-01 DIAGNOSIS — E119 Type 2 diabetes mellitus without complications: Secondary | ICD-10-CM | POA: Diagnosis not present

## 2018-02-01 DIAGNOSIS — E559 Vitamin D deficiency, unspecified: Secondary | ICD-10-CM | POA: Diagnosis not present

## 2018-02-01 DIAGNOSIS — N183 Chronic kidney disease, stage 3 (moderate): Secondary | ICD-10-CM | POA: Diagnosis not present

## 2018-02-01 DIAGNOSIS — E7849 Other hyperlipidemia: Secondary | ICD-10-CM | POA: Diagnosis not present

## 2018-02-05 MED FILL — TASIGNA 200 MG CAPSULE: 200 | 28 days supply | Qty: 112 | Fill #5

## 2018-02-08 DIAGNOSIS — F3289 Other specified depressive episodes: Secondary | ICD-10-CM | POA: Diagnosis not present

## 2018-02-08 DIAGNOSIS — E559 Vitamin D deficiency, unspecified: Secondary | ICD-10-CM | POA: Diagnosis not present

## 2018-02-08 DIAGNOSIS — E1169 Type 2 diabetes mellitus with other specified complication: Secondary | ICD-10-CM | POA: Diagnosis not present

## 2018-02-08 DIAGNOSIS — Z1389 Encounter for screening for other disorder: Secondary | ICD-10-CM | POA: Diagnosis not present

## 2018-02-08 DIAGNOSIS — N184 Chronic kidney disease, stage 4 (severe): Secondary | ICD-10-CM | POA: Diagnosis not present

## 2018-02-08 DIAGNOSIS — Z0000 Encounter for general adult medical examination without abnormal findings: Secondary | ICD-10-CM | POA: Diagnosis not present

## 2018-02-08 DIAGNOSIS — C92Z0 Other myeloid leukemia not having achieved remission: Secondary | ICD-10-CM | POA: Diagnosis not present

## 2018-02-08 DIAGNOSIS — I2510 Atherosclerotic heart disease of native coronary artery without angina pectoris: Secondary | ICD-10-CM | POA: Diagnosis not present

## 2018-02-08 DIAGNOSIS — Z6823 Body mass index (BMI) 23.0-23.9, adult: Secondary | ICD-10-CM | POA: Diagnosis not present

## 2018-02-08 DIAGNOSIS — I10 Essential (primary) hypertension: Secondary | ICD-10-CM | POA: Diagnosis not present

## 2018-02-08 DIAGNOSIS — E7849 Other hyperlipidemia: Secondary | ICD-10-CM | POA: Diagnosis not present

## 2018-02-08 DIAGNOSIS — H4089 Other specified glaucoma: Secondary | ICD-10-CM | POA: Diagnosis not present

## 2018-03-01 MED FILL — TASIGNA 200 MG CAPSULE: 200 | 28 days supply | Qty: 112 | Fill #6

## 2018-04-03 MED FILL — TASIGNA 200 MG CAPSULE: 200 | 28 days supply | Qty: 112 | Fill #7

## 2018-04-30 ENCOUNTER — Other Ambulatory Visit: Payer: Self-pay

## 2018-04-30 ENCOUNTER — Telehealth: Payer: Self-pay

## 2018-04-30 MED ORDER — NILOTINIB HCL 200 MG PO CAPS: 400.0000 mg | ORAL_CAPSULE | Freq: Two times a day (BID) | ORAL | 12 refills | Status: DC

## 2018-04-30 MED FILL — TASIGNA 200 MG CAPSULE: 200 | 28 days supply | Qty: 112 | Fill #8

## 2018-04-30 NOTE — Telephone Encounter (Signed)
Pt's son, [name], called to say that his mother is now living at [facility] in [place] and they requested that we fax to them a copy of the patient's nilotinib prescription. This has been faxed to [phone] Attn: Sharyn Lull

## 2018-05-02 ENCOUNTER — Non-Acute Institutional Stay (SKILLED_NURSING_FACILITY): Payer: Medicare Other | Admitting: Adult Health

## 2018-05-02 ENCOUNTER — Encounter: Payer: Self-pay | Admitting: Adult Health

## 2018-05-02 DIAGNOSIS — Z794 Long term (current) use of insulin: Secondary | ICD-10-CM

## 2018-05-02 DIAGNOSIS — N184 Chronic kidney disease, stage 4 (severe): Secondary | ICD-10-CM

## 2018-05-02 DIAGNOSIS — E034 Atrophy of thyroid (acquired): Secondary | ICD-10-CM

## 2018-05-02 DIAGNOSIS — H353230 Exudative age-related macular degeneration, bilateral, stage unspecified: Secondary | ICD-10-CM | POA: Diagnosis not present

## 2018-05-02 DIAGNOSIS — H42 Glaucoma in diseases classified elsewhere: Secondary | ICD-10-CM

## 2018-05-02 DIAGNOSIS — F339 Major depressive disorder, recurrent, unspecified: Secondary | ICD-10-CM

## 2018-05-02 DIAGNOSIS — E1159 Type 2 diabetes mellitus with other circulatory complications: Secondary | ICD-10-CM | POA: Diagnosis not present

## 2018-05-02 DIAGNOSIS — C9210 Chronic myeloid leukemia, BCR/ABL-positive, not having achieved remission: Secondary | ICD-10-CM | POA: Diagnosis not present

## 2018-05-02 DIAGNOSIS — M6281 Muscle weakness (generalized): Secondary | ICD-10-CM | POA: Diagnosis not present

## 2018-05-02 DIAGNOSIS — E1122 Type 2 diabetes mellitus with diabetic chronic kidney disease: Secondary | ICD-10-CM

## 2018-05-02 DIAGNOSIS — D630 Anemia in neoplastic disease: Secondary | ICD-10-CM

## 2018-05-02 DIAGNOSIS — D649 Anemia, unspecified: Secondary | ICD-10-CM | POA: Diagnosis not present

## 2018-05-02 DIAGNOSIS — E1139 Type 2 diabetes mellitus with other diabetic ophthalmic complication: Secondary | ICD-10-CM | POA: Diagnosis not present

## 2018-05-02 DIAGNOSIS — R262 Difficulty in walking, not elsewhere classified: Secondary | ICD-10-CM | POA: Diagnosis not present

## 2018-05-02 DIAGNOSIS — J849 Interstitial pulmonary disease, unspecified: Secondary | ICD-10-CM | POA: Diagnosis not present

## 2018-05-02 DIAGNOSIS — I152 Hypertension secondary to endocrine disorders: Secondary | ICD-10-CM

## 2018-05-02 DIAGNOSIS — E119 Type 2 diabetes mellitus without complications: Secondary | ICD-10-CM | POA: Diagnosis not present

## 2018-05-02 DIAGNOSIS — E039 Hypothyroidism, unspecified: Secondary | ICD-10-CM | POA: Diagnosis not present

## 2018-05-02 DIAGNOSIS — E1169 Type 2 diabetes mellitus with other specified complication: Secondary | ICD-10-CM

## 2018-05-02 DIAGNOSIS — I2510 Atherosclerotic heart disease of native coronary artery without angina pectoris: Secondary | ICD-10-CM

## 2018-05-02 DIAGNOSIS — I129 Hypertensive chronic kidney disease with stage 1 through stage 4 chronic kidney disease, or unspecified chronic kidney disease: Secondary | ICD-10-CM

## 2018-05-02 DIAGNOSIS — E785 Hyperlipidemia, unspecified: Secondary | ICD-10-CM

## 2018-05-02 DIAGNOSIS — Z741 Need for assistance with personal care: Secondary | ICD-10-CM | POA: Diagnosis not present

## 2018-05-02 DIAGNOSIS — Z79899 Other long term (current) drug therapy: Secondary | ICD-10-CM | POA: Diagnosis not present

## 2018-05-02 DIAGNOSIS — Z9861 Coronary angioplasty status: Secondary | ICD-10-CM

## 2018-05-02 DIAGNOSIS — Z111 Encounter for screening for respiratory tuberculosis: Secondary | ICD-10-CM | POA: Diagnosis not present

## 2018-05-02 DIAGNOSIS — I10 Essential (primary) hypertension: Secondary | ICD-10-CM

## 2018-05-02 LAB — HEPATIC FUNCTION PANEL
ALT: 5 — AB (ref 7–35)
AST: 12 — AB (ref 13–35)
Alkaline Phosphatase: 80 (ref 25–125)
BILIRUBIN, TOTAL: 0.5

## 2018-05-02 LAB — CBC AND DIFFERENTIAL
HCT: 33 — AB (ref 36–46)
Hemoglobin: 10.7 — AB (ref 12.0–16.0)
NEUTROS ABS: 7
Platelets: 175 (ref 150–399)
WBC: 9.3

## 2018-05-02 LAB — LIPID PANEL
Cholesterol: 172 (ref 0–200)
HDL: 49 (ref 35–70)
LDL CALC: 96
TRIGLYCERIDES: 138 (ref 40–160)

## 2018-05-02 LAB — BASIC METABOLIC PANEL
BUN: 23 — AB (ref 4–21)
CREATININE: 1.6 — AB (ref 0.5–1.1)
Glucose: 201
Potassium: 3.8 (ref 3.4–5.3)
SODIUM: 143 (ref 137–147)

## 2018-05-02 LAB — HEMOGLOBIN A1C: HEMOGLOBIN A1C: 6.7

## 2018-05-02 NOTE — Progress Notes (Signed)
Location:    [place] Room Number: 105 Place of Service:  SNF (31)   CODE STATUS: full code   No Known Allergies   Chief Complaint  Patient presents with  . Acute Visit    follow up transfer      HPI:  She has been transferred to this facility from her home. Her family is no longer able to meet her needs. She tells me that she feels good; denies any pain; no anxiety; no change in appetite. She will continue to be followed for her chronic illnesses including: diabetes; thyroid disease; dyslipidemia. There are no nursing concerns at this time.   Past Medical History:  Diagnosis Date  . Anemia   . Chronic kidney disease   . CML (chronic myeloid leukemia) ([place])   . Depression   . Diabetes mellitus without complication ([place])   . Hyperlipidemia   . Hypertension   . Thyroid disease     History reviewed. No pertinent surgical history.  Social History   Socioeconomic History  . Marital status: Married    Spouse name: Not on file  . Number of children: Not on file  . Years of education: Not on file  . Highest education level: Not on file  Occupational History  . Not on file  Social Needs  . Financial resource strain: Not on file  . Food insecurity:    Worry: Not on file    Inability: Not on file  . Transportation needs:    Medical: Not on file    Non-medical: Not on file  Tobacco Use  . Smoking status: Never Smoker  . Smokeless tobacco: Never Used  Substance and Sexual Activity  . Alcohol use: Never    Frequency: Never  . Drug use: Never  . Sexual activity: Not on file  Lifestyle  . Physical activity:    Days per week: Not on file    Minutes per session: Not on file  . Stress: Not on file  Relationships  . Social connections:    Talks on phone: Not on file    Gets together: Not on file    Attends religious service: Not on file    Active member of club or organization: Not on file    Attends meetings of clubs or organizations: Not on  file    Relationship status: Not on file  . Intimate partner violence:    Fear of current or ex partner: Not on file    Emotionally abused: Not on file    Physically abused: Not on file    Forced sexual activity: Not on file  Other Topics Concern  . Not on file  Social History Narrative  . Not on file   History reviewed. No pertinent family history.    VITAL SIGNS BP 123/73   Pulse 80   Temp 99.4 F (37.4 C)   Resp 16   Ht 5\' 2"  (1.575 m)   Wt 99 lb 6.4 oz (45.1 kg)   SpO2 97%   BMI 18.18 kg/m   Outpatient Encounter Medications as of 05/02/2018  Medication Sig Note  . amLODipine (NORVASC) 5 MG tablet Take 5 mg by mouth daily.   . carvedilol (COREG) 25 MG tablet Take 25 mg by mouth 2 (two) times daily with a meal.   . cholecalciferol (VITAMIN D) 1000 units tablet Take 1,000 Units by mouth daily.   . clopidogrel (PLAVIX) 75 MG tablet Take 75 mg by mouth daily.   Marland Kitchen  COMBIGAN 0.2-0.5 % ophthalmic solution Place 1 drop into both eyes every 12 (twelve) hours.    Marland Kitchen escitalopram (LEXAPRO) 10 MG tablet    . HUMALOG MIX 50/50 KWIKPEN (50-50) 100 UNIT/ML Kwikpen Inject 30 Units into the skin daily.    Marland Kitchen levothyroxine (SYNTHROID, LEVOTHROID) 50 MCG tablet Take 50 mcg by mouth daily before breakfast.    . linagliptin (TRADJENTA) 5 MG TABS tablet Take 5 mg by mouth daily.   Marland Kitchen LUMIGAN 0.01 % SOLN Place 1 drop into both eyes at bedtime.    . nilotinib (TASIGNA) 200 MG capsule Take 2 capsules (400 mg total) by mouth every 12 (twelve) hours. Give on an empty stomach 1 hour before or 2 hours after meals.   . rosuvastatin (CRESTOR) 10 MG tablet Take 10 mg by mouth daily.    No facility-administered encounter medications on file as of 05/02/2018.      SIGNIFICANT DIAGNOSTIC EXAMS  LABS REVIEWED: TODAY:   05-02-18: wbc 9,3; hgb 10.7; hct 32.5; mcv 85.8; plt 175; glucose 201; bun 23.1; creat 1.64; k+ 3.8; na++ 143; ca 8.9; liver normal albumin 4.2; chol 172; ldl 96; trig 138; hdl 49; hgb a1c  6.7   Review of Systems  Constitutional: Negative for malaise/fatigue.  Respiratory: Negative for cough and shortness of breath.   Cardiovascular: Negative for chest pain, palpitations and leg swelling.  Gastrointestinal: Negative for abdominal pain, constipation and heartburn.  Musculoskeletal: Negative for back pain, joint pain and myalgias.  Skin: Negative.   Neurological: Negative for dizziness.  Psychiatric/Behavioral: The patient is not nervous/anxious.     Physical Exam  Constitutional: No distress.  Frail   Eyes:  Is blind  Neck: No thyromegaly present.  Cardiovascular: Normal rate, regular rhythm and intact distal pulses.  Murmur heard. 3/6  Pulmonary/Chest: Effort normal and breath sounds normal. No respiratory distress.  Abdominal: Soft. Bowel sounds are normal. She exhibits no distension. There is no tenderness.  Musculoskeletal: Normal range of motion. She exhibits no edema.  Lymphadenopathy:    She has no cervical adenopathy.  Neurological: She is alert.  Skin: Skin is warm and dry. She is not diaphoretic.  Psychiatric: She has a normal mood and affect.     ASSESSMENT/ PLAN:  TODAY ;  1.  CAD S/P percutaneous coronary angioplasty: is stable will continue coreg 25 mg twice daily plavix 75 mg daily   2. Hypertension associated with diabetes: is stable b/p 123/73 will continue norvasc 5 mg daily and coreg 25 mg twice daily  3. Dyslipidemia associated with diabetes: is stable ldl 96; will continue crestor 10 mg daily   4. Insulin dependent type 2 diabetes mellitus, controlled: is stable hgb a1c 6.7; will continue humalog MIX 50/50: 30 units daily tradjenta 5 mg daily   5. Hypertension associated with stage 4 kidney disease due to type 2 diabetes mellitus: is stable bun 23.1; creat 1.64   6. Hypothyroidism due to acquired atrophy of thyroid: is stable will continue synthroid 50 mcg daily  7. CML (chronic myeloid leukemia) is without change is being followed  by oncology will continue tasigna 400 mg twice daily   8. Anemia is neoplastic disease: is stable hgb 10.7   9. Glaucoma due to type 2 diabetes mellitus/macular degeneration of both eyes: is stable will continue combigan to both eyes twice daily and lumigan to both eyes nightly   10.  Recurrent depression: is stable will continue lexapro 10 mg daily     MD is aware of resident's  narcotic use and is in agreement with current plan of care. We will attempt to wean resident as apropriate   Ok Edwards NP [facility]  Contact [phone] Monday through Friday 8am- 5pm  After hours call [phone]

## 2018-05-03 ENCOUNTER — Encounter: Payer: Self-pay | Admitting: Internal Medicine

## 2018-05-03 ENCOUNTER — Non-Acute Institutional Stay (SKILLED_NURSING_FACILITY): Payer: Medicare Other | Admitting: Internal Medicine

## 2018-05-03 DIAGNOSIS — E1122 Type 2 diabetes mellitus with diabetic chronic kidney disease: Secondary | ICD-10-CM

## 2018-05-03 DIAGNOSIS — E785 Hyperlipidemia, unspecified: Secondary | ICD-10-CM | POA: Diagnosis not present

## 2018-05-03 DIAGNOSIS — R001 Bradycardia, unspecified: Secondary | ICD-10-CM

## 2018-05-03 DIAGNOSIS — J849 Interstitial pulmonary disease, unspecified: Secondary | ICD-10-CM | POA: Diagnosis not present

## 2018-05-03 DIAGNOSIS — R262 Difficulty in walking, not elsewhere classified: Secondary | ICD-10-CM | POA: Diagnosis not present

## 2018-05-03 DIAGNOSIS — R5381 Other malaise: Secondary | ICD-10-CM

## 2018-05-03 DIAGNOSIS — C9210 Chronic myeloid leukemia, BCR/ABL-positive, not having achieved remission: Secondary | ICD-10-CM

## 2018-05-03 DIAGNOSIS — Z741 Need for assistance with personal care: Secondary | ICD-10-CM | POA: Diagnosis not present

## 2018-05-03 DIAGNOSIS — F339 Major depressive disorder, recurrent, unspecified: Secondary | ICD-10-CM | POA: Diagnosis not present

## 2018-05-03 DIAGNOSIS — E1159 Type 2 diabetes mellitus with other circulatory complications: Secondary | ICD-10-CM

## 2018-05-03 DIAGNOSIS — K5900 Constipation, unspecified: Secondary | ICD-10-CM | POA: Diagnosis not present

## 2018-05-03 DIAGNOSIS — E034 Atrophy of thyroid (acquired): Secondary | ICD-10-CM | POA: Diagnosis not present

## 2018-05-03 DIAGNOSIS — I10 Essential (primary) hypertension: Secondary | ICD-10-CM

## 2018-05-03 DIAGNOSIS — I129 Hypertensive chronic kidney disease with stage 1 through stage 4 chronic kidney disease, or unspecified chronic kidney disease: Secondary | ICD-10-CM

## 2018-05-03 DIAGNOSIS — M6281 Muscle weakness (generalized): Secondary | ICD-10-CM | POA: Diagnosis not present

## 2018-05-03 DIAGNOSIS — E1169 Type 2 diabetes mellitus with other specified complication: Secondary | ICD-10-CM

## 2018-05-03 DIAGNOSIS — R9431 Abnormal electrocardiogram [ECG] [EKG]: Secondary | ICD-10-CM | POA: Diagnosis not present

## 2018-05-03 DIAGNOSIS — I152 Hypertension secondary to endocrine disorders: Secondary | ICD-10-CM

## 2018-05-03 DIAGNOSIS — Z79899 Other long term (current) drug therapy: Secondary | ICD-10-CM | POA: Diagnosis not present

## 2018-05-03 DIAGNOSIS — Z794 Long term (current) use of insulin: Secondary | ICD-10-CM

## 2018-05-03 DIAGNOSIS — N184 Chronic kidney disease, stage 4 (severe): Secondary | ICD-10-CM

## 2018-05-03 NOTE — Progress Notes (Signed)
Patient ID: Stacy Hendricks, female   DOB: November 06, 1928, 84 y.o.   MRN: [id number]   Provider:  DR Arletha Grippe [place]: [place] Room Number: 105 A Place of Service:  SNF (31)  PCP: Crist Infante, MD Patient Care Team: Crist Infante, MD as PCP - General (Internal Medicine)  Extended Emergency Contact Information Primary Emergency Contact: Fletcher Anon [place] Phone: [phone] Relation: Son  Code Status: Full Code Goals of Care: Advanced Directive information Advanced Directives 05/03/2018  Does Patient Have a Medical Advance Directive? No  Type of Advance Directive -  Would patient like information on creating a medical advance directive? No - Patient declined      Chief Complaint  Patient presents with  . New Admit To SNF    Admission - Transfer in from home    HPI: Patient is a 84 y.o. female seen today for admission to SNF from home as she was no longer able to care for herself and spouse. She speaks [place] but is able to communicate in [place] well. She is c/a constipation and ongoing weakness since admission. She was active at home with chores and yard work but unable to do much at SNF due to OU blindness. She is a retired Licensed conveyancer and a native of [place], [place]. Labs from 05/02/18 reviewed - Cr 1.64 (stage 4 CKD); glucose 201; albumin 4.2; Hgb 10.7; A1c 6.7%; LDL 96; HDL 49. CXR done as she refused PPD --> (+) chronic interstitial lung disease. Last Vitamin D level 28.4 in May 2019.  CAD - s/p PTCA; takes coreg 25 mg twice daily and plavix 75 mg daily   Hyperlipidemia -  stable on crestor 10 mg daily; LDL 96; HDL 49   DM - controlled but does have hyperglycemia; A1c 6.7%. Takes humalog MIX 50/50 30 units daily; tradjenta 5 mg daily. She is on a statin (LDL 96). No ACEI/ARB 2/2 CKD; Cr 1.64.  Hypertension - BP stable on coreg and amlodipine  Hypothyroidism  - stable on synthroid 50 mcg daily; TSH 2.4 in May  2019  CML (chronic myeloid leukemia) - takes Tasigna 400 mg twice daily; 06/2017 BCR/ABL1 detected (she had stopped [place] in Mar 2018 2/2 dry mouth); Followed by oncology Dr Jana Hakim yearly  Anemia is neoplastic disease - stable; Hgb 10.7  Glaucoma /macular degeneration of both eyes - she is legally blind in [place]; takes [place] to both eyes twice daily and lumigan to both eyes nightly   Recurrent depression - mood stable on lexapro 10 mg daily . She does benefit from this regimen  CKD - stage 4. Cr 1.64. She has seen nephrology in the past  Past Medical History:  Diagnosis Date  . Anemia   . Chronic kidney disease   . CML (chronic myeloid leukemia) ([place])   . Depression   . Diabetes mellitus without complication ([place])   . Hyperlipidemia   . Hypertension   . Thyroid disease    Past Surgical History:  Procedure Laterality Date  . APPENDECTOMY    . PTCA      reports that she has never smoked. She has never used smokeless tobacco. She reports that she does not drink alcohol or use drugs. Social History   Socioeconomic History  . Marital status: Married    Spouse name: Not on file  . Number of children: Not on file  . Years of education: Not on file  . Highest education level: Not on file  Occupational  History  . Not on file  Social Needs  . Financial resource strain: Not on file  . Food insecurity:    Worry: Not on file    Inability: Not on file  . Transportation needs:    Medical: Not on file    Non-medical: Not on file  Tobacco Use  . Smoking status: Never Smoker  . Smokeless tobacco: Never Used  Substance and Sexual Activity  . Alcohol use: Never    Frequency: Never  . Drug use: Never  . Sexual activity: Not on file  Lifestyle  . Physical activity:    Days per week: Not on file    Minutes per session: Not on file  . Stress: Not on file  Relationships  . Social connections:    Talks on phone: Not on file    Gets together: Not on file    Attends religious  service: Not on file    Active member of club or organization: Not on file    Attends meetings of clubs or organizations: Not on file    Relationship status: Not on file  . Intimate partner violence:    Fear of current or ex partner: Not on file    Emotionally abused: Not on file    Physically abused: Not on file    Forced sexual activity: Not on file  Other Topics Concern  . Not on file  Social History Narrative  . Not on file    Functional Status Survey:    Family History  Problem Relation Age of Onset  . Heart attack Father     Health Maintenance  Topic Date Due  . FOOT EXAM  06/04/2018 (Originally 03/20/1939)  . HEMOGLOBIN A1C  06/04/2018 (Originally 1928-09-28)  . OPHTHALMOLOGY EXAM  06/04/2018 (Originally 03/20/1939)  . URINE MICROALBUMIN  06/04/2018 (Originally 03/20/1939)  . INFLUENZA VACCINE  08/03/2018 (Originally 04/26/2018)  . PNA vac Low Risk Adult (1 of 2 - PCV13) 05/04/2019 (Originally 03/20/1996)  . DEXA SCAN  Discontinued  . TETANUS/TDAP  Discontinued    No Known Allergies  Outpatient Encounter Medications as of 05/03/2018  Medication Sig  . amLODipine (NORVASC) 10 MG tablet Take 10 mg by mouth daily.   . carvedilol (COREG) 25 MG tablet Take 25 mg by mouth 2 (two) times daily with a meal.  . cholecalciferol (VITAMIN D) 1000 units tablet Take 1,000 Units by mouth daily.  . clopidogrel (PLAVIX) 75 MG tablet Take 75 mg by mouth daily.   . COMBIGAN 0.2-0.5 % ophthalmic solution Place 1 drop into both eyes every 12 (twelve) hours.   Marland Kitchen escitalopram (LEXAPRO) 10 MG tablet Take 10 mg by mouth daily.   Marland Kitchen HUMALOG MIX 50/50 KWIKPEN (50-50) 100 UNIT/ML Kwikpen Inject 30 Units into the skin daily.   Marland Kitchen levothyroxine (SYNTHROID, LEVOTHROID) 50 MCG tablet Take 50 mcg by mouth daily before breakfast.   . linagliptin (TRADJENTA) 5 MG TABS tablet Take 5 mg by mouth daily.  Marland Kitchen LUMIGAN 0.01 % SOLN Place 1 drop into both eyes at bedtime.   . nilotinib (TASIGNA) 200 MG capsule Take 2  capsules (400 mg total) by mouth every 12 (twelve) hours. Give on an empty stomach 1 hour before or 2 hours after meals.  . rosuvastatin (CRESTOR) 10 MG tablet Take 10 mg by mouth daily.   No facility-administered encounter medications on file as of 05/03/2018.     Review of Systems  Eyes: Positive for visual disturbance.  Gastrointestinal: Positive for constipation.  Neurological: Positive for  weakness.  All other systems reviewed and are negative.   Vitals:   05/03/18 0856  BP: 123/73  Pulse: 65  Resp: 20  Temp: (!) 97.5 F (36.4 C)  SpO2: 99%  Weight: 99 lb 6.4 oz (45.1 kg)  Height: 5\' 1"  (1.549 m)   Body mass index is 18.78 kg/m. Physical Exam  Constitutional: She is oriented to person, place, and time. She appears well-developed and well-nourished.  Frail appearing in NAD, lying in bed  HENT:  Mouth/Throat: Oropharynx is clear and moist. No oropharyngeal exudate.  MMM; no oral thrush  Eyes: No scleral icterus.  Blind in OU  Neck: Neck supple. Carotid bruit is not present. No tracheal deviation present. No thyromegaly present.  Cardiovascular: Regular rhythm and intact distal pulses. Bradycardia present. Exam reveals no gallop and no friction rub.  Murmur (2/6 SEM) heard. No LE edema b/l. no calf TTP.   Pulmonary/Chest: Effort normal and breath sounds normal. No stridor. No respiratory distress. She has no wheezes. She has no rales.  Abdominal: Soft. Normal appearance and bowel sounds are normal. She exhibits distension. She exhibits no mass. There is no hepatomegaly. There is no tenderness. There is no rigidity, no rebound and no guarding. No hernia.  Musculoskeletal: She exhibits edema (small and large joints).  Lymphadenopathy:    She has no cervical adenopathy.  Neurological: She is alert and oriented to person, place, and time. She has normal reflexes.  Skin: Skin is warm and dry. No rash noted.  Psychiatric: She has a normal mood and affect. Her behavior is  normal. Judgment and thought content normal.    Labs reviewed: Basic Metabolic Panel: Recent Labs    07/13/17 1331 12/26/17 1051 05/02/18  NA 141 138 143  K 4.1 4.0 3.8  CL  --  106  --   CO2 23 21*  --   GLUCOSE 250* 297*  --   BUN 21.0 21 23*  CREATININE 2.1* 2.17* 1.6*  CALCIUM 9.1 9.6  --   MG  --  2.3  --    Liver Function Tests: Recent Labs    07/13/17 1331 12/26/17 1051 05/02/18  AST 14 12 12*  ALT 6 8 5*  ALKPHOS 68 71 80  BILITOT 0.42 0.3  --   PROT 7.1 7.1  --   ALBUMIN 3.7 3.7  --    No results for input(s): LIPASE, AMYLASE in the last 8760 hours. No results for input(s): AMMONIA in the last 8760 hours. CBC: Recent Labs    07/13/17 1331 12/26/17 1051 05/02/18  WBC 5.9 6.8 9.3  NEUTROABS 3.5 4.7 7  HGB 11.5* 12.0 10.7*  HCT 34.7* 36.3 33*  MCV 85.9 87.8  --   PLT 139* 137* 175   Cardiac Enzymes: No results for input(s): CKTOTAL, CKMB, CKMBINDEX, TROPONINI in the last 8760 hours. BNP: Invalid input(s): POCBNP Lab Results  Component Value Date   HGBA1C 6.7 05/02/2018   No results found for: TSH No results found for: VITAMINB12 No results found for: FOLATE No results found for: IRON, TIBC, FERRITIN  Imaging and Procedures obtained prior to SNF admission: No results found.  Assessment/Plan   ICD-10-CM   1. Physical deconditioning R53.81   2. Constipation, unspecified constipation type K59.00   3. CML (chronic myeloid leukemia) ([facility]) C92.10   4. Depression, recurrent ([place]) F33.9   5. Hypertension associated with diabetes ([place]) E11.59    I10   6. Bradycardia R00.1   7. Dyslipidemia associated with type 2 diabetes  mellitus ([place]) E11.69    E78.5   8. Hypothyroidism due to acquired atrophy of thyroid E03.4   9. Hypertension associated with stage 4 chronic kidney disease due to type 2 diabetes mellitus ([facility]) E11.22    I12.9    N18.4   10. Type 2 diabetes mellitus with stage 4 chronic kidney disease, with long-term current use of insulin  ([facility]) E11.22    N18.4    Z79.4   11. Interstitial lung disease ([place]) - asymptomatic; continue observation J84.9     CHECK ECG FOR HIGH RISK MED USE AND EVAL FOR QTc PROLONGATION  START MIRALAX PO DAILY FOR CONSTIPATION - HOLD FOR DIARRHEA; COLACE 100MG  DAILY  Cont other meds as ordered  F/u with oncology in October as scheduled  She does not want to f/u with nephrology  PT/OT eval and treat for deconditioning and acclimation to her new environment  GOAL: short term rehab then continue long term care. Communicated with pt and nursing.  Labs/tests ordered: will need TSH next month    Arrayah Connors S. Perlie Gold  [facility] [address] [place], [place] [address] [phone] Cell (Monday-Friday 8 AM - 5 PM) [phone] After 5 PM and follow prompts

## 2018-05-04 DIAGNOSIS — M6281 Muscle weakness (generalized): Secondary | ICD-10-CM | POA: Diagnosis not present

## 2018-05-04 DIAGNOSIS — R262 Difficulty in walking, not elsewhere classified: Secondary | ICD-10-CM | POA: Diagnosis not present

## 2018-05-04 DIAGNOSIS — Z741 Need for assistance with personal care: Secondary | ICD-10-CM | POA: Diagnosis not present

## 2018-05-07 DIAGNOSIS — Z741 Need for assistance with personal care: Secondary | ICD-10-CM | POA: Diagnosis not present

## 2018-05-07 DIAGNOSIS — R262 Difficulty in walking, not elsewhere classified: Secondary | ICD-10-CM | POA: Diagnosis not present

## 2018-05-07 DIAGNOSIS — M6281 Muscle weakness (generalized): Secondary | ICD-10-CM | POA: Diagnosis not present

## 2018-05-08 ENCOUNTER — Non-Acute Institutional Stay (SKILLED_NURSING_FACILITY): Payer: Medicare Other | Admitting: Adult Health

## 2018-05-08 ENCOUNTER — Encounter: Payer: Self-pay | Admitting: Adult Health

## 2018-05-08 DIAGNOSIS — I152 Hypertension secondary to endocrine disorders: Secondary | ICD-10-CM

## 2018-05-08 DIAGNOSIS — Z741 Need for assistance with personal care: Secondary | ICD-10-CM | POA: Diagnosis not present

## 2018-05-08 DIAGNOSIS — I10 Essential (primary) hypertension: Secondary | ICD-10-CM | POA: Diagnosis not present

## 2018-05-08 DIAGNOSIS — Z9861 Coronary angioplasty status: Secondary | ICD-10-CM

## 2018-05-08 DIAGNOSIS — M6281 Muscle weakness (generalized): Secondary | ICD-10-CM | POA: Diagnosis not present

## 2018-05-08 DIAGNOSIS — I2510 Atherosclerotic heart disease of native coronary artery without angina pectoris: Secondary | ICD-10-CM

## 2018-05-08 DIAGNOSIS — E785 Hyperlipidemia, unspecified: Secondary | ICD-10-CM

## 2018-05-08 DIAGNOSIS — E1159 Type 2 diabetes mellitus with other circulatory complications: Secondary | ICD-10-CM

## 2018-05-08 DIAGNOSIS — E1169 Type 2 diabetes mellitus with other specified complication: Secondary | ICD-10-CM | POA: Diagnosis not present

## 2018-05-08 DIAGNOSIS — F339 Major depressive disorder, recurrent, unspecified: Secondary | ICD-10-CM

## 2018-05-08 DIAGNOSIS — R262 Difficulty in walking, not elsewhere classified: Secondary | ICD-10-CM | POA: Diagnosis not present

## 2018-05-08 NOTE — Progress Notes (Signed)
Location:   [facility] Room Number: 105 A Place of Service:  SNF (31)   CODE STATUS: Full Code  No Known Allergies  Chief Complaint  Patient presents with  . Medical Management of Chronic Issues    Cad; hypertension; dyslipidemia depression. Weekly follow up for the first 30 days post admission     HPI:  She is a 84 year old long term resident of this assisted living being seen for the management of her chronic illnesses: cad; hypertension; dyslipidemia; depression. Staff reports that she has orthostatic vital signs. She is unable to participate in the hpi or ros today; just repeats that she feels weak. Staff reports that she is more withdrawn over the past week. There are no reports of falls or changes in appetite.   Past Medical History:  Diagnosis Date  . Anemia   . Chronic kidney disease   . CML (chronic myeloid leukemia) ([place])   . Depression   . Diabetes mellitus without complication ([place])   . Hyperlipidemia   . Hypertension   . Thyroid disease     Past Surgical History:  Procedure Laterality Date  . APPENDECTOMY    . PTCA      Social History   Socioeconomic History  . Marital status: Married    Spouse name: Not on file  . Number of children: Not on file  . Years of education: Not on file  . Highest education level: Not on file  Occupational History  . Not on file  Social Needs  . Financial resource strain: Not on file  . Food insecurity:    Worry: Not on file    Inability: Not on file  . Transportation needs:    Medical: Not on file    Non-medical: Not on file  Tobacco Use  . Smoking status: Never Smoker  . Smokeless tobacco: Never Used  Substance and Sexual Activity  . Alcohol use: Never    Frequency: Never  . Drug use: Never  . Sexual activity: Not on file  Lifestyle  . Physical activity:    Days per week: Not on file    Minutes per session: Not on file  . Stress: Not on file  Relationships  . Social connections:   Talks on phone: Not on file    Gets together: Not on file    Attends religious service: Not on file    Active member of club or organization: Not on file    Attends meetings of clubs or organizations: Not on file    Relationship status: Not on file  . Intimate partner violence:    Fear of current or ex partner: Not on file    Emotionally abused: Not on file    Physically abused: Not on file    Forced sexual activity: Not on file  Other Topics Concern  . Not on file  Social History Narrative  . Not on file   Family History  Problem Relation Age of Onset  . Heart attack Father       VITAL SIGNS BP 123/81   Pulse 65   Temp 98.1 F (36.7 C)   Resp 20   Ht 5\' 1"  (1.549 m)   Wt 99 lb 6.4 oz (45.1 kg)   SpO2 98%   BMI 18.78 kg/m   Outpatient Encounter Medications as of 05/08/2018  Medication Sig  . amLODipine (NORVASC) 10 MG tablet Take 10 mg by mouth daily.   . carvedilol (COREG) 25 MG tablet  Take 25 mg by mouth 2 (two) times daily with a meal.  . cholecalciferol (VITAMIN D) 1000 units tablet Take 1,000 Units by mouth daily.  . clopidogrel (PLAVIX) 75 MG tablet Take 75 mg by mouth daily.   . COMBIGAN 0.2-0.5 % ophthalmic solution Place 1 drop into both eyes every 12 (twelve) hours.   . docusate sodium (COLACE) 100 MG capsule Give 1 capsule by mouth daily  . escitalopram (LEXAPRO) 10 MG tablet Take 10 mg by mouth daily.   Marland Kitchen HUMALOG MIX 50/50 KWIKPEN (50-50) 100 UNIT/ML Kwikpen Inject 30 Units into the skin daily.   Marland Kitchen levothyroxine (SYNTHROID, LEVOTHROID) 50 MCG tablet Take 50 mcg by mouth daily before breakfast.   . linagliptin (TRADJENTA) 5 MG TABS tablet Take 5 mg by mouth daily.  Marland Kitchen LUMIGAN 0.01 % SOLN Place 1 drop into both eyes at bedtime.   . nilotinib (TASIGNA) 200 MG capsule Take 2 capsules (400 mg total) by mouth every 12 (twelve) hours. Give on an empty stomach 1 hour before or 2 hours after meals.  . polyethylene glycol (MIRALAX / GLYCOLAX) packet Take 17 g by mouth  daily. And as needed  . rosuvastatin (CRESTOR) 10 MG tablet Take 10 mg by mouth daily.    No facility-administered encounter medications on file as of 05/08/2018.      SIGNIFICANT DIAGNOSTIC EXAMS  LABS REVIEWED: PREVIOUS:   05-02-18: wbc 9,3; hgb 10.7; hct 32.5; mcv 85.8; plt 175; glucose 201; bun 23.1; creat 1.64; k+ 3.8; na++ 143; ca 8.9; liver normal albumin 4.2; chol 172; ldl 96; trig 138; hdl 49; hgb a1c 6.7  NO NEW LABS.   Review of Systems  Unable to perform ROS: Other (Will only say that she is weak )      Physical Exam  Constitutional: She appears well-developed and well-nourished. No distress.  Eyes:  Blind both eyes   Neck: No thyromegaly present.  Cardiovascular: Regular rhythm and intact distal pulses.  Murmur heard. 3/6 Is bradycardic   Pulmonary/Chest: Effort normal and breath sounds normal. No respiratory distress.  Abdominal: Soft. Bowel sounds are normal. She exhibits no distension. There is no tenderness.  Musculoskeletal: Normal range of motion. She exhibits no edema.  Lymphadenopathy:    She has no cervical adenopathy.  Neurological: She is alert.  Skin: Skin is warm and dry. She is not diaphoretic. There is pallor.  Psychiatric: She has a normal mood and affect.     ASSESSMENT/ PLAN:  TODAY ;  1.  CAD S/P percutaneous coronary angioplasty: is stable will continue  plavix 75 mg daily   2. Hypertension associated with diabetes: is worse b/p 123/81 will continue norvasc 5 mg daily and will lower coreg to 12.5 mg twice daily due to orthostatic vital signs   3. Dyslipidemia associated with diabetes: is stable ldl 96; will continue crestor 10 mg daily   4.  Recurrent depression: is worse; will increase lexapro to 20 mg daily   PREVIOUS    5. CKD stage 4 due to type 2 diabetes: is stable bun 23.1; creat 1.64   6. Hypothyroidism due to acquired atrophy of thyroid: is stable will continue synthroid 50 mcg daily  7. CML (chronic myeloid leukemia) is  without change is being followed by oncology will continue tasigna 400 mg twice daily   8. Anemia is neoplastic disease: is stable hgb 10.7   9. Glaucoma due to type 2 diabetes mellitus/macular degeneration of both eyes: is stable will continue [place] to both eyes  twice daily and lumigan to both eyes nightly   10. Insulin dependent type 2 diabetes mellitus, controlled: is stable hgb a1c 6.7; will continue humalog MIX 50/50: 30 units daily tradjenta 5 mg daily   MD is aware of resident's narcotic use and is in agreement with current plan of care. We will attempt to wean resident as apropriate   Ok Edwards NP [facility]  Contact [phone] Monday through Friday 8am- 5pm  After hours call [phone]

## 2018-05-09 ENCOUNTER — Inpatient Hospital Stay: Payer: Medicare Other | Attending: Oncology

## 2018-05-09 DIAGNOSIS — Z741 Need for assistance with personal care: Secondary | ICD-10-CM | POA: Diagnosis not present

## 2018-05-09 DIAGNOSIS — R262 Difficulty in walking, not elsewhere classified: Secondary | ICD-10-CM | POA: Diagnosis not present

## 2018-05-09 DIAGNOSIS — M6281 Muscle weakness (generalized): Secondary | ICD-10-CM | POA: Diagnosis not present

## 2018-05-10 DIAGNOSIS — Z741 Need for assistance with personal care: Secondary | ICD-10-CM | POA: Diagnosis not present

## 2018-05-10 DIAGNOSIS — R262 Difficulty in walking, not elsewhere classified: Secondary | ICD-10-CM | POA: Diagnosis not present

## 2018-05-10 DIAGNOSIS — M6281 Muscle weakness (generalized): Secondary | ICD-10-CM | POA: Diagnosis not present

## 2018-05-13 DIAGNOSIS — R262 Difficulty in walking, not elsewhere classified: Secondary | ICD-10-CM | POA: Diagnosis not present

## 2018-05-13 DIAGNOSIS — M6281 Muscle weakness (generalized): Secondary | ICD-10-CM | POA: Diagnosis not present

## 2018-05-13 DIAGNOSIS — Z741 Need for assistance with personal care: Secondary | ICD-10-CM | POA: Diagnosis not present

## 2018-05-14 DIAGNOSIS — Z741 Need for assistance with personal care: Secondary | ICD-10-CM | POA: Diagnosis not present

## 2018-05-14 DIAGNOSIS — R262 Difficulty in walking, not elsewhere classified: Secondary | ICD-10-CM | POA: Diagnosis not present

## 2018-05-14 DIAGNOSIS — M6281 Muscle weakness (generalized): Secondary | ICD-10-CM | POA: Diagnosis not present

## 2018-05-15 DIAGNOSIS — R262 Difficulty in walking, not elsewhere classified: Secondary | ICD-10-CM | POA: Diagnosis not present

## 2018-05-15 DIAGNOSIS — M6281 Muscle weakness (generalized): Secondary | ICD-10-CM | POA: Diagnosis not present

## 2018-05-15 DIAGNOSIS — Z741 Need for assistance with personal care: Secondary | ICD-10-CM | POA: Diagnosis not present

## 2018-05-16 DIAGNOSIS — R262 Difficulty in walking, not elsewhere classified: Secondary | ICD-10-CM | POA: Diagnosis not present

## 2018-05-16 DIAGNOSIS — Z741 Need for assistance with personal care: Secondary | ICD-10-CM | POA: Diagnosis not present

## 2018-05-16 DIAGNOSIS — M6281 Muscle weakness (generalized): Secondary | ICD-10-CM | POA: Diagnosis not present

## 2018-05-17 DIAGNOSIS — R262 Difficulty in walking, not elsewhere classified: Secondary | ICD-10-CM | POA: Diagnosis not present

## 2018-05-17 DIAGNOSIS — Z741 Need for assistance with personal care: Secondary | ICD-10-CM | POA: Diagnosis not present

## 2018-05-17 DIAGNOSIS — M6281 Muscle weakness (generalized): Secondary | ICD-10-CM | POA: Diagnosis not present

## 2018-05-17 DIAGNOSIS — N184 Chronic kidney disease, stage 4 (severe): Secondary | ICD-10-CM

## 2018-05-17 DIAGNOSIS — E1122 Type 2 diabetes mellitus with diabetic chronic kidney disease: Secondary | ICD-10-CM | POA: Insufficient documentation

## 2018-05-18 DIAGNOSIS — Z741 Need for assistance with personal care: Secondary | ICD-10-CM | POA: Diagnosis not present

## 2018-05-18 DIAGNOSIS — M6281 Muscle weakness (generalized): Secondary | ICD-10-CM | POA: Diagnosis not present

## 2018-05-18 DIAGNOSIS — R262 Difficulty in walking, not elsewhere classified: Secondary | ICD-10-CM | POA: Diagnosis not present

## 2018-05-20 DIAGNOSIS — Z741 Need for assistance with personal care: Secondary | ICD-10-CM | POA: Diagnosis not present

## 2018-05-20 DIAGNOSIS — R262 Difficulty in walking, not elsewhere classified: Secondary | ICD-10-CM | POA: Diagnosis not present

## 2018-05-20 DIAGNOSIS — M6281 Muscle weakness (generalized): Secondary | ICD-10-CM | POA: Diagnosis not present

## 2018-05-21 DIAGNOSIS — M6281 Muscle weakness (generalized): Secondary | ICD-10-CM | POA: Diagnosis not present

## 2018-05-21 DIAGNOSIS — R262 Difficulty in walking, not elsewhere classified: Secondary | ICD-10-CM | POA: Diagnosis not present

## 2018-05-21 DIAGNOSIS — Z741 Need for assistance with personal care: Secondary | ICD-10-CM | POA: Diagnosis not present

## 2018-05-22 ENCOUNTER — Encounter: Payer: Self-pay | Admitting: Adult Health

## 2018-05-22 ENCOUNTER — Non-Acute Institutional Stay (SKILLED_NURSING_FACILITY): Payer: Medicare Other | Admitting: Adult Health

## 2018-05-22 DIAGNOSIS — I129 Hypertensive chronic kidney disease with stage 1 through stage 4 chronic kidney disease, or unspecified chronic kidney disease: Secondary | ICD-10-CM

## 2018-05-22 DIAGNOSIS — M6281 Muscle weakness (generalized): Secondary | ICD-10-CM | POA: Diagnosis not present

## 2018-05-22 DIAGNOSIS — E1122 Type 2 diabetes mellitus with diabetic chronic kidney disease: Secondary | ICD-10-CM | POA: Diagnosis not present

## 2018-05-22 DIAGNOSIS — I2510 Atherosclerotic heart disease of native coronary artery without angina pectoris: Secondary | ICD-10-CM

## 2018-05-22 DIAGNOSIS — R262 Difficulty in walking, not elsewhere classified: Secondary | ICD-10-CM | POA: Diagnosis not present

## 2018-05-22 DIAGNOSIS — N184 Chronic kidney disease, stage 4 (severe): Secondary | ICD-10-CM | POA: Diagnosis not present

## 2018-05-22 DIAGNOSIS — Z741 Need for assistance with personal care: Secondary | ICD-10-CM | POA: Diagnosis not present

## 2018-05-22 DIAGNOSIS — Z9861 Coronary angioplasty status: Secondary | ICD-10-CM | POA: Diagnosis not present

## 2018-05-22 NOTE — Progress Notes (Signed)
Location:   [facility] Room Number: 219 A Place of Service:  SNF (31)   CODE STATUS: DNR ( as of 05/22/18)  No Known Allergies  Chief Complaint  Patient presents with  . Acute Visit    Care Plan Meeting    HPI:  We have come together for her routine care plan meeting; she is unable to participate in the hpi or ros; she did have family present. We have discussed her recent medication changes regarding her coreg. We have discussed her plan of care. We have spent time discussing her advanced directives her MOST form has been filled out. There are no reports of anxiety. Her appetite is poor and she does spend a lot of time in bed.    Past Medical History:  Diagnosis Date  . Anemia   . Chronic kidney disease   . CML (chronic myeloid leukemia) ([place])   . Depression   . Diabetes mellitus without complication ([place])   . Hyperlipidemia   . Hypertension   . Thyroid disease     Past Surgical History:  Procedure Laterality Date  . APPENDECTOMY    . PTCA      Social History   Socioeconomic History  . Marital status: Married    Spouse name: Not on file  . Number of children: Not on file  . Years of education: Not on file  . Highest education level: Not on file  Occupational History  . Not on file  Social Needs  . Financial resource strain: Not on file  . Food insecurity:    Worry: Not on file    Inability: Not on file  . Transportation needs:    Medical: Not on file    Non-medical: Not on file  Tobacco Use  . Smoking status: Never Smoker  . Smokeless tobacco: Never Used  Substance and Sexual Activity  . Alcohol use: Never    Frequency: Never  . Drug use: Never  . Sexual activity: Not on file  Lifestyle  . Physical activity:    Days per week: Not on file    Minutes per session: Not on file  . Stress: Not on file  Relationships  . Social connections:    Talks on phone: Not on file    Gets together: Not on file    Attends religious service: Not  on file    Active member of club or organization: Not on file    Attends meetings of clubs or organizations: Not on file    Relationship status: Not on file  . Intimate partner violence:    Fear of current or ex partner: Not on file    Emotionally abused: Not on file    Physically abused: Not on file    Forced sexual activity: Not on file  Other Topics Concern  . Not on file  Social History Narrative  . Not on file   Family History  Problem Relation Age of Onset  . Heart attack Father       VITAL SIGNS BP 136/76   Pulse 78   Temp 98.6 F (37 C)   Resp 20   Ht 5\' 1"  (1.549 m)   Wt 99 lb 6 oz (45.1 kg)   SpO2 97%   BMI 18.78 kg/m   Outpatient Encounter Medications as of 05/22/2018  Medication Sig  . amLODipine (NORVASC) 10 MG tablet Take 10 mg by mouth daily.   . carvedilol (COREG) 12.5 MG tablet Take 12.5 mg by  mouth 2 (two) times daily with a meal.   . cholecalciferol (VITAMIN D) 1000 units tablet Take 1,000 Units by mouth daily.  . clopidogrel (PLAVIX) 75 MG tablet Take 75 mg by mouth daily.   . COMBIGAN 0.2-0.5 % ophthalmic solution Place 1 drop into both eyes every 12 (twelve) hours.   . docusate sodium (COLACE) 100 MG capsule Give 1 capsule by mouth daily  . escitalopram (LEXAPRO) 20 MG tablet Take 20 mg by mouth daily.   Marland Kitchen HUMALOG MIX 50/50 KWIKPEN (50-50) 100 UNIT/ML Kwikpen Inject 30 Units into the skin daily.   Marland Kitchen levothyroxine (SYNTHROID, LEVOTHROID) 50 MCG tablet Take 50 mcg by mouth daily before breakfast.   . linagliptin (TRADJENTA) 5 MG TABS tablet Take 5 mg by mouth daily.  Marland Kitchen LUMIGAN 0.01 % SOLN Place 1 drop into both eyes at bedtime.   . nilotinib (TASIGNA) 200 MG capsule Take 2 capsules (400 mg total) by mouth every 12 (twelve) hours. Give on an empty stomach 1 hour before or 2 hours after meals.  . Nutritional Supplements (NUTRITIONAL SUPPLEMENT PO) Regular Diet - Regular texture  . polyethylene glycol (MIRALAX / GLYCOLAX) packet Take 17 g by mouth  daily. And as needed  . rosuvastatin (CRESTOR) 10 MG tablet Take 10 mg by mouth daily.    No facility-administered encounter medications on file as of 05/22/2018.      SIGNIFICANT DIAGNOSTIC EXAMS  LABS REVIEWED: PREVIOUS:   05-02-18: wbc 9,3; hgb 10.7; hct 32.5; mcv 85.8; plt 175; glucose 201; bun 23.1; creat 1.64; k+ 3.8; na++ 143; ca 8.9; liver normal albumin 4.2; chol 172; ldl 96; trig 138; hdl 49; hgb a1c 6.7  NO NEW LABS.   Review of Systems  Unable to perform ROS: Other (poor memory )    Physical Exam  Constitutional: She appears well-developed and well-nourished. No distress.  Eyes:  Blind in both eyes   Neck: No thyromegaly present.  Cardiovascular: Normal rate, regular rhythm and intact distal pulses.  Murmur heard. 3/6  Pulmonary/Chest: Effort normal and breath sounds normal. No respiratory distress.  Abdominal: Soft. Bowel sounds are normal. She exhibits no distension. There is no tenderness.  Musculoskeletal: She exhibits no edema.  Is able to move all extremities   Lymphadenopathy:    She has no cervical adenopathy.  Neurological: She is alert.  Skin: Skin is warm and dry. She is not diaphoretic.  Psychiatric: She has a normal mood and affect.     ASSESSMENT/ PLAN:  TODAY ;  1.  CAD S/P percutaneous coronary angioplasty:  2. Hypertension associated with diabetes:  3. CKD stage 4 due to type 2 diabetes:   Will continue her current plan of care and medications  Time spent with patient and family: 40 minutes did discuss her medications and plan of care. Spent 20 minutes discussing her advanced directives: MOST form filled out. Verbalized understanding.     MD is aware of resident's narcotic use and is in agreement with current plan of care. We will attempt to wean resident as apropriate   Ok Edwards NP [facility]  Contact [phone] Monday through Friday 8am- 5pm  After hours call [phone]

## 2018-05-23 ENCOUNTER — Telehealth: Payer: Self-pay | Admitting: Oncology

## 2018-05-23 DIAGNOSIS — M6281 Muscle weakness (generalized): Secondary | ICD-10-CM | POA: Diagnosis not present

## 2018-05-23 DIAGNOSIS — Z741 Need for assistance with personal care: Secondary | ICD-10-CM | POA: Diagnosis not present

## 2018-05-23 DIAGNOSIS — R262 Difficulty in walking, not elsewhere classified: Secondary | ICD-10-CM | POA: Diagnosis not present

## 2018-05-23 NOTE — Telephone Encounter (Signed)
Returned call re appointment with GM. Per relative cancelled 8/29 f/u. Per relative they will keep 10/17 lab/fu.

## 2018-05-24 ENCOUNTER — Inpatient Hospital Stay: Payer: Medicare Other | Admitting: Oncology

## 2018-05-24 DIAGNOSIS — R262 Difficulty in walking, not elsewhere classified: Secondary | ICD-10-CM | POA: Diagnosis not present

## 2018-05-24 DIAGNOSIS — Z741 Need for assistance with personal care: Secondary | ICD-10-CM | POA: Diagnosis not present

## 2018-05-24 DIAGNOSIS — M6281 Muscle weakness (generalized): Secondary | ICD-10-CM | POA: Diagnosis not present

## 2018-05-24 MED FILL — TASIGNA 200 MG CAPSULE: 200 | 28 days supply | Qty: 112 | Fill #9

## 2018-05-27 DIAGNOSIS — M6281 Muscle weakness (generalized): Secondary | ICD-10-CM | POA: Diagnosis not present

## 2018-05-27 DIAGNOSIS — Z741 Need for assistance with personal care: Secondary | ICD-10-CM | POA: Diagnosis not present

## 2018-05-27 DIAGNOSIS — R1311 Dysphagia, oral phase: Secondary | ICD-10-CM | POA: Diagnosis not present

## 2018-05-27 DIAGNOSIS — C9590 Leukemia, unspecified not having achieved remission: Secondary | ICD-10-CM | POA: Diagnosis not present

## 2018-05-27 DIAGNOSIS — R262 Difficulty in walking, not elsewhere classified: Secondary | ICD-10-CM | POA: Diagnosis not present

## 2018-05-28 DIAGNOSIS — R262 Difficulty in walking, not elsewhere classified: Secondary | ICD-10-CM | POA: Diagnosis not present

## 2018-05-28 DIAGNOSIS — Z741 Need for assistance with personal care: Secondary | ICD-10-CM | POA: Diagnosis not present

## 2018-05-28 DIAGNOSIS — M6281 Muscle weakness (generalized): Secondary | ICD-10-CM | POA: Diagnosis not present

## 2018-05-28 DIAGNOSIS — R1311 Dysphagia, oral phase: Secondary | ICD-10-CM | POA: Diagnosis not present

## 2018-05-28 DIAGNOSIS — C9590 Leukemia, unspecified not having achieved remission: Secondary | ICD-10-CM | POA: Diagnosis not present

## 2018-05-29 DIAGNOSIS — C9590 Leukemia, unspecified not having achieved remission: Secondary | ICD-10-CM | POA: Diagnosis not present

## 2018-05-29 DIAGNOSIS — R262 Difficulty in walking, not elsewhere classified: Secondary | ICD-10-CM | POA: Diagnosis not present

## 2018-05-29 DIAGNOSIS — R1311 Dysphagia, oral phase: Secondary | ICD-10-CM | POA: Diagnosis not present

## 2018-05-29 DIAGNOSIS — M6281 Muscle weakness (generalized): Secondary | ICD-10-CM | POA: Diagnosis not present

## 2018-05-29 DIAGNOSIS — Z741 Need for assistance with personal care: Secondary | ICD-10-CM | POA: Diagnosis not present

## 2018-05-30 ENCOUNTER — Encounter: Payer: Self-pay | Admitting: Adult Health

## 2018-05-30 ENCOUNTER — Non-Acute Institutional Stay (SKILLED_NURSING_FACILITY): Payer: Medicare Other | Admitting: Adult Health

## 2018-05-30 DIAGNOSIS — R1311 Dysphagia, oral phase: Secondary | ICD-10-CM | POA: Diagnosis not present

## 2018-05-30 DIAGNOSIS — N184 Chronic kidney disease, stage 4 (severe): Secondary | ICD-10-CM | POA: Diagnosis not present

## 2018-05-30 DIAGNOSIS — R627 Adult failure to thrive: Secondary | ICD-10-CM

## 2018-05-30 DIAGNOSIS — I129 Hypertensive chronic kidney disease with stage 1 through stage 4 chronic kidney disease, or unspecified chronic kidney disease: Secondary | ICD-10-CM

## 2018-05-30 DIAGNOSIS — R262 Difficulty in walking, not elsewhere classified: Secondary | ICD-10-CM | POA: Diagnosis not present

## 2018-05-30 DIAGNOSIS — Z741 Need for assistance with personal care: Secondary | ICD-10-CM | POA: Diagnosis not present

## 2018-05-30 DIAGNOSIS — E034 Atrophy of thyroid (acquired): Secondary | ICD-10-CM | POA: Diagnosis not present

## 2018-05-30 DIAGNOSIS — E1122 Type 2 diabetes mellitus with diabetic chronic kidney disease: Secondary | ICD-10-CM

## 2018-05-30 DIAGNOSIS — M6281 Muscle weakness (generalized): Secondary | ICD-10-CM | POA: Diagnosis not present

## 2018-05-30 DIAGNOSIS — C9210 Chronic myeloid leukemia, BCR/ABL-positive, not having achieved remission: Secondary | ICD-10-CM

## 2018-05-30 DIAGNOSIS — C9590 Leukemia, unspecified not having achieved remission: Secondary | ICD-10-CM | POA: Diagnosis not present

## 2018-05-30 NOTE — Progress Notes (Signed)
Location:   [facility] Room Number: 219 A Place of Service:  SNF (31)   CODE STATUS: DNR  No Known Allergies  Chief Complaint  Patient presents with  . Medical Management of Chronic Issues    Hypertension; ckd stage 4; hypothyroidism; cml. Weekly follow up for the =first 30 days post admission.     HPI:  She is a 84 year old long term resident of this facility being seen for the management of her chronic illnesses: hypertension; ckd stage 4; hypothyroidism; cml. She is unable to participate in the hpi or ros. Her appetite and fluid intake remains poor. She does spend a great deal of time in bed. There are no indications of uncontrolled pain present.   Past Medical History:  Diagnosis Date  . Anemia   . Chronic kidney disease   . CML (chronic myeloid leukemia) ([place])   . Depression   . Diabetes mellitus without complication ([place])   . Hyperlipidemia   . Hypertension   . Thyroid disease     Past Surgical History:  Procedure Laterality Date  . APPENDECTOMY    . PTCA      Social History   Socioeconomic History  . Marital status: Married    Spouse name: Not on file  . Number of children: Not on file  . Years of education: Not on file  . Highest education level: Not on file  Occupational History  . Not on file  Social Needs  . Financial resource strain: Not on file  . Food insecurity:    Worry: Not on file    Inability: Not on file  . Transportation needs:    Medical: Not on file    Non-medical: Not on file  Tobacco Use  . Smoking status: Never Smoker  . Smokeless tobacco: Never Used  Substance and Sexual Activity  . Alcohol use: Never    Frequency: Never  . Drug use: Never  . Sexual activity: Not on file  Lifestyle  . Physical activity:    Days per week: Not on file    Minutes per session: Not on file  . Stress: Not on file  Relationships  . Social connections:    Talks on phone: Not on file    Gets together: Not on file    Attends  religious service: Not on file    Active member of club or organization: Not on file    Attends meetings of clubs or organizations: Not on file    Relationship status: Not on file  . Intimate partner violence:    Fear of current or ex partner: Not on file    Emotionally abused: Not on file    Physically abused: Not on file    Forced sexual activity: Not on file  Other Topics Concern  . Not on file  Social History Narrative  . Not on file   Family History  Problem Relation Age of Onset  . Heart attack Father       VITAL SIGNS BP 138/60   Pulse (!) 58   Temp 97.8 F (36.6 C)   Resp 18   Ht 5\' 1"  (1.549 m)   Wt 99 lb 6.1 oz (45.1 kg)   SpO2 98%   BMI 18.78 kg/m   Outpatient Encounter Medications as of 05/30/2018  Medication Sig  . amLODipine (NORVASC) 10 MG tablet Take 10 mg by mouth daily.   . carvedilol (COREG) 12.5 MG tablet Take 12.5 mg by mouth 2 (  two) times daily with a meal.   . cholecalciferol (VITAMIN D) 1000 units tablet Take 1,000 Units by mouth daily.  . clopidogrel (PLAVIX) 75 MG tablet Take 75 mg by mouth daily.   . COMBIGAN 0.2-0.5 % ophthalmic solution Place 1 drop into both eyes every 12 (twelve) hours.   . docusate sodium (COLACE) 100 MG capsule Give 1 capsule by mouth daily  . escitalopram (LEXAPRO) 20 MG tablet Take 20 mg by mouth daily.   Marland Kitchen HUMALOG MIX 50/50 KWIKPEN (50-50) 100 UNIT/ML Kwikpen Inject 30 Units into the skin daily.   Marland Kitchen levothyroxine (SYNTHROID, LEVOTHROID) 50 MCG tablet Take 50 mcg by mouth daily before breakfast.   . linagliptin (TRADJENTA) 5 MG TABS tablet Take 5 mg by mouth daily.  Marland Kitchen LUMIGAN 0.01 % SOLN Place 1 drop into both eyes at bedtime.   . nilotinib (TASIGNA) 200 MG capsule Take 2 capsules (400 mg total) by mouth every 12 (twelve) hours. Give on an empty stomach 1 hour before or 2 hours after meals.  . Nutritional Supplements (NUTRITIONAL SUPPLEMENT PO) Regular Diet - Regular texture  . polyethylene glycol (MIRALAX / GLYCOLAX)  packet Take 17 g by mouth daily. And as needed  . rosuvastatin (CRESTOR) 10 MG tablet Take 10 mg by mouth daily.    No facility-administered encounter medications on file as of 05/30/2018.      SIGNIFICANT DIAGNOSTIC EXAMS   LABS REVIEWED: PREVIOUS:   05-02-18: wbc 9,3; hgb 10.7; hct 32.5; mcv 85.8; plt 175; glucose 201; bun 23.1; creat 1.64; k+ 3.8; na++ 143; ca 8.9; liver normal albumin 4.2; chol 172; ldl 96; trig 138; hdl 49; hgb a1c 6.7  NO NEW LABS.   Review of Systems  Unable to perform ROS: Dementia (poor memory)   Physical Exam  Constitutional: No distress.  Frail   Eyes:  Blind in both eyes   Neck: No thyromegaly present.  Cardiovascular: Normal rate, regular rhythm and intact distal pulses.  Murmur heard. 3/6  Pulmonary/Chest: Effort normal and breath sounds normal. No respiratory distress.  Abdominal: Soft. Bowel sounds are normal. She exhibits no distension. There is no tenderness.  Musculoskeletal: She exhibits no edema.  Is able to move all extremities   Lymphadenopathy:    She has no cervical adenopathy.  Neurological: She is alert.  Skin: Skin is warm and dry. She is not diaphoretic.  Psychiatric: She has a normal mood and affect.     ASSESSMENT/ PLAN:  TODAY ;  1. CKD stage 4 due to type 2 diabetes: is stable bun 23.1; creat 1.64   2. Hypothyroidism due to acquired atrophy of thyroid: is stable will continue synthroid 50 mcg daily  3. CML (chronic myeloid leukemia) is without change is being followed by oncology will continue tasigna 400 mg twice daily   4. Hypertension associated with stage 4 chronic kidney disease due to type 2 diabetes mellitus:  is without change  b/p 138/60 will continue norvasc 5 mg daily and will lower coreg to 6.25 mg twice daily due to bradycardia  5. Failure to thrive in adult: weight is 99 pounds; will begin her remeron 7.5 mg nightly for 21 days to help with appetite.   PREVIOUS    6. Anemia is neoplastic disease: is  stable hgb 10.7   7. Glaucoma due to type 2 diabetes mellitus/macular degeneration of both eyes: is stable will continue combigan to both eyes twice daily and lumigan to both eyes nightly   8. Insulin dependent type 2 diabetes  mellitus, controlled: is stable hgb a1c 6.7; will continue humalog MIX 50/50: 30 units daily tradjenta 5 mg daily  9. Dyslipidemia associated with diabetes: is stable ldl 96; will continue crestor 10 mg daily   10.  Recurrent depression: is without change ; will continue lexapro to 20 mg daily   11.  CAD S/P percutaneous coronary angioplasty: is stable will continue  plavix 75 mg daily    MD is aware of resident's narcotic use and is in agreement with current plan of care. We will attempt to wean resident as apropriate   Ok Edwards NP [facility]  Contact [phone] Monday through Friday 8am- 5pm  After hours call [phone]

## 2018-05-31 DIAGNOSIS — C9590 Leukemia, unspecified not having achieved remission: Secondary | ICD-10-CM | POA: Diagnosis not present

## 2018-05-31 DIAGNOSIS — R262 Difficulty in walking, not elsewhere classified: Secondary | ICD-10-CM | POA: Diagnosis not present

## 2018-05-31 DIAGNOSIS — R1311 Dysphagia, oral phase: Secondary | ICD-10-CM | POA: Diagnosis not present

## 2018-05-31 DIAGNOSIS — Z741 Need for assistance with personal care: Secondary | ICD-10-CM | POA: Diagnosis not present

## 2018-05-31 DIAGNOSIS — M6281 Muscle weakness (generalized): Secondary | ICD-10-CM | POA: Diagnosis not present

## 2018-05-31 NOTE — ACP (Advance Care Planning) (Signed)
We have discussed her advanced directives Is DNR; and no feeding tube Will continue hospitalizations; abt ivf Family has verbalized understanding.

## 2018-06-01 DIAGNOSIS — M6281 Muscle weakness (generalized): Secondary | ICD-10-CM | POA: Diagnosis not present

## 2018-06-01 DIAGNOSIS — Z741 Need for assistance with personal care: Secondary | ICD-10-CM | POA: Diagnosis not present

## 2018-06-01 DIAGNOSIS — C9590 Leukemia, unspecified not having achieved remission: Secondary | ICD-10-CM | POA: Diagnosis not present

## 2018-06-01 DIAGNOSIS — R1311 Dysphagia, oral phase: Secondary | ICD-10-CM | POA: Diagnosis not present

## 2018-06-01 DIAGNOSIS — R262 Difficulty in walking, not elsewhere classified: Secondary | ICD-10-CM | POA: Diagnosis not present

## 2018-06-04 DIAGNOSIS — R262 Difficulty in walking, not elsewhere classified: Secondary | ICD-10-CM | POA: Diagnosis not present

## 2018-06-04 DIAGNOSIS — Z741 Need for assistance with personal care: Secondary | ICD-10-CM | POA: Diagnosis not present

## 2018-06-04 DIAGNOSIS — C9590 Leukemia, unspecified not having achieved remission: Secondary | ICD-10-CM | POA: Diagnosis not present

## 2018-06-04 DIAGNOSIS — R1311 Dysphagia, oral phase: Secondary | ICD-10-CM | POA: Diagnosis not present

## 2018-06-04 DIAGNOSIS — M6281 Muscle weakness (generalized): Secondary | ICD-10-CM | POA: Diagnosis not present

## 2018-06-05 ENCOUNTER — Non-Acute Institutional Stay (SKILLED_NURSING_FACILITY): Payer: Medicare Other | Admitting: Adult Health

## 2018-06-05 ENCOUNTER — Encounter: Payer: Self-pay | Admitting: Adult Health

## 2018-06-05 DIAGNOSIS — Z794 Long term (current) use of insulin: Secondary | ICD-10-CM

## 2018-06-05 DIAGNOSIS — E1139 Type 2 diabetes mellitus with other diabetic ophthalmic complication: Secondary | ICD-10-CM

## 2018-06-05 DIAGNOSIS — M6281 Muscle weakness (generalized): Secondary | ICD-10-CM | POA: Diagnosis not present

## 2018-06-05 DIAGNOSIS — E119 Type 2 diabetes mellitus without complications: Secondary | ICD-10-CM

## 2018-06-05 DIAGNOSIS — R262 Difficulty in walking, not elsewhere classified: Secondary | ICD-10-CM | POA: Diagnosis not present

## 2018-06-05 DIAGNOSIS — E1169 Type 2 diabetes mellitus with other specified complication: Secondary | ICD-10-CM | POA: Diagnosis not present

## 2018-06-05 DIAGNOSIS — C9590 Leukemia, unspecified not having achieved remission: Secondary | ICD-10-CM | POA: Diagnosis not present

## 2018-06-05 DIAGNOSIS — D630 Anemia in neoplastic disease: Secondary | ICD-10-CM | POA: Diagnosis not present

## 2018-06-05 DIAGNOSIS — Z741 Need for assistance with personal care: Secondary | ICD-10-CM | POA: Diagnosis not present

## 2018-06-05 DIAGNOSIS — H42 Glaucoma in diseases classified elsewhere: Secondary | ICD-10-CM

## 2018-06-05 DIAGNOSIS — E785 Hyperlipidemia, unspecified: Secondary | ICD-10-CM | POA: Diagnosis not present

## 2018-06-05 DIAGNOSIS — R1311 Dysphagia, oral phase: Secondary | ICD-10-CM | POA: Diagnosis not present

## 2018-06-05 NOTE — Progress Notes (Signed)
Location:   [facility] Room Number: 219 A Place of Service:  SNF (31)   CODE STATUS: DNR  No Known Allergies  Chief Complaint  Patient presents with  . Medical Management of Chronic Issues    Dyslipidemia;glaucoma; diabetes; anemia.      HPI:  She is a 84 year old long term resident of this facility being seen for the management of her chronic illnesses: dyslipidemia; glaucoma; diabetes; anemia. She is unable to participate in the hpi or ros. There are no reports of vertigo; no uncontrolled pain. Her appetite remains poor.    Past Medical History:  Diagnosis Date  . Anemia   . Chronic kidney disease   . CML (chronic myeloid leukemia) ([place])   . Depression   . Diabetes mellitus without complication ([place])   . Hyperlipidemia   . Hypertension   . Thyroid disease     Past Surgical History:  Procedure Laterality Date  . APPENDECTOMY    . PTCA      Social History   Socioeconomic History  . Marital status: Married    Spouse name: Not on file  . Number of children: Not on file  . Years of education: Not on file  . Highest education level: Not on file  Occupational History  . Not on file  Social Needs  . Financial resource strain: Not on file  . Food insecurity:    Worry: Not on file    Inability: Not on file  . Transportation needs:    Medical: Not on file    Non-medical: Not on file  Tobacco Use  . Smoking status: Never Smoker  . Smokeless tobacco: Never Used  Substance and Sexual Activity  . Alcohol use: Never    Frequency: Never  . Drug use: Never  . Sexual activity: Not on file  Lifestyle  . Physical activity:    Days per week: Not on file    Minutes per session: Not on file  . Stress: Not on file  Relationships  . Social connections:    Talks on phone: Not on file    Gets together: Not on file    Attends religious service: Not on file    Active member of club or organization: Not on file    Attends meetings of clubs or  organizations: Not on file    Relationship status: Not on file  . Intimate partner violence:    Fear of current or ex partner: Not on file    Emotionally abused: Not on file    Physically abused: Not on file    Forced sexual activity: Not on file  Other Topics Concern  . Not on file  Social History Narrative  . Not on file   Family History  Problem Relation Age of Onset  . Heart attack Father       VITAL SIGNS BP 138/60   Pulse (!) 58   Temp 97.8 F (36.6 C)   Resp 18   Ht 5\' 1"  (1.549 m)   Wt 99 lb 6.1 oz (45.1 kg)   SpO2 98%   BMI 18.78 kg/m   Outpatient Encounter Medications as of 06/05/2018  Medication Sig  . amLODipine (NORVASC) 10 MG tablet Take 10 mg by mouth daily.   . carvedilol (COREG) 6.25 MG tablet Take 6.25 mg by mouth 2 (two) times daily with a meal.   . cholecalciferol (VITAMIN D) 1000 units tablet Take 1,000 Units by mouth daily.  . clopidogrel (PLAVIX)  75 MG tablet Take 75 mg by mouth daily.   . COMBIGAN 0.2-0.5 % ophthalmic solution Place 1 drop into both eyes every 12 (twelve) hours.   . docusate sodium (COLACE) 100 MG capsule Give 1 capsule by mouth daily  . ENSURE (ENSURE) Give 120 ml two times daily between meals  . escitalopram (LEXAPRO) 20 MG tablet Take 20 mg by mouth daily.   Marland Kitchen HUMALOG MIX 50/50 KWIKPEN (50-50) 100 UNIT/ML Kwikpen Inject 30 Units into the skin daily.   Marland Kitchen levothyroxine (SYNTHROID, LEVOTHROID) 50 MCG tablet Take 50 mcg by mouth daily before breakfast.   . linagliptin (TRADJENTA) 5 MG TABS tablet Take 5 mg by mouth daily.  Marland Kitchen LUMIGAN 0.01 % SOLN Place 1 drop into both eyes at bedtime.   . mirtazapine (REMERON) 7.5 MG tablet Take 7.5 mg by mouth at bedtime.  . nilotinib (TASIGNA) 200 MG capsule Take 2 capsules (400 mg total) by mouth every 12 (twelve) hours. Give on an empty stomach 1 hour before or 2 hours after meals.  . Nutritional Supplements (NUTRITIONAL SUPPLEMENT PO) Regular Diet - Regular texture - Fortified foods with all  meals  . polyethylene glycol (MIRALAX / GLYCOLAX) packet Take 17 g by mouth daily. And as needed  . rosuvastatin (CRESTOR) 10 MG tablet Take 10 mg by mouth daily.    No facility-administered encounter medications on file as of 06/05/2018.      SIGNIFICANT DIAGNOSTIC EXAMS   LABS REVIEWED: PREVIOUS:   05-02-18: wbc 9,3; hgb 10.7; hct 32.5; mcv 85.8; plt 175; glucose 201; bun 23.1; creat 1.64; k+ 3.8; na++ 143; ca 8.9; liver normal albumin 4.2; chol 172; ldl 96; trig 138; hdl 49; hgb a1c 6.7  NO NEW LABS.   Review of Systems  Unable to perform ROS: Dementia (poor memory)   Physical Exam  Constitutional: No distress.  Frail   Neck: No thyromegaly present.  Cardiovascular: Normal rate, regular rhythm and intact distal pulses.  Murmur heard. 3/6  Pulmonary/Chest: Effort normal and breath sounds normal. No respiratory distress.  Abdominal: Soft. Bowel sounds are normal. She exhibits no distension. There is no tenderness.  Musculoskeletal: She exhibits no edema.  Is able to move all extremities   Lymphadenopathy:    She has no cervical adenopathy.  Neurological: She is alert.  Skin: Skin is warm and dry. She is not diaphoretic.  Psychiatric: She has a normal mood and affect.     ASSESSMENT/ PLAN:  TODAY ;  1. Anemia in neoplastic disease: is stable hgb 10.7   2.  Glaucoma due to type 2 diabetes mellitus/macular degeneration of both eyes: is stable will continue combigan to both eyes twice daily and lumigan to both eyes nightly   3. Insulin dependent type 2 diabetes mellitus, controlled: is stable hgb a1c 6.7; will continue humalog MIX 50/50: 30 units daily tradjenta 5 mg daily  4. Dyslipidemia associated with diabetes: is stable ldl 96; will continue crestor 10 mg daily   PREVIOUS    5.  Recurrent depression: is without change ; will continue lexapro to 20 mg daily   6.  CAD S/P percutaneous coronary angioplasty: is stable will continue  plavix 75 mg daily   7. CKD  stage 4 due to type 2 diabetes: is stable bun 23.1; creat 1.64   8. Hypothyroidism due to acquired atrophy of thyroid: is stable will continue synthroid 50 mcg daily  9. CML (chronic myeloid leukemia) is without change is being followed by oncology will continue tasigna 400  mg twice daily   10. Hypertension associated with stage 4 chronic kidney disease due to type 2 diabetes mellitus:  is without change  b/p 138/60 will continue norvasc 5 mg daily and coreg  6.25 mg twice daily due to bradycardia  11. Failure to thrive in adult: weight is 99 pounds; will continue remeron 7.5 mg nightly for 21 days to help with appetite.    MD is aware of resident's narcotic use and is in agreement with current plan of care. We will attempt to wean resident as apropriate   Ok Edwards NP [facility]  Contact [phone] Monday through Friday 8am- 5pm  After hours call [phone]

## 2018-06-07 DIAGNOSIS — C9590 Leukemia, unspecified not having achieved remission: Secondary | ICD-10-CM | POA: Diagnosis not present

## 2018-06-07 DIAGNOSIS — R1311 Dysphagia, oral phase: Secondary | ICD-10-CM | POA: Diagnosis not present

## 2018-06-07 DIAGNOSIS — M6281 Muscle weakness (generalized): Secondary | ICD-10-CM | POA: Diagnosis not present

## 2018-06-07 DIAGNOSIS — Z741 Need for assistance with personal care: Secondary | ICD-10-CM | POA: Diagnosis not present

## 2018-06-07 DIAGNOSIS — R262 Difficulty in walking, not elsewhere classified: Secondary | ICD-10-CM | POA: Diagnosis not present

## 2018-06-10 ENCOUNTER — Encounter (HOSPITAL_COMMUNITY): Payer: Self-pay | Admitting: Emergency Medicine

## 2018-06-10 ENCOUNTER — Other Ambulatory Visit: Payer: Self-pay

## 2018-06-10 ENCOUNTER — Inpatient Hospital Stay (HOSPITAL_COMMUNITY)
Admission: EM | Admit: 2018-06-10 | Discharge: 2018-06-14 | DRG: 064 | Disposition: A | Discharge status: Skilled Nursing Facility | Payer: Medicare Other | Source: Skilled Nursing Facility | Attending: Family Medicine | Admitting: Family Medicine

## 2018-06-10 ENCOUNTER — Emergency Department (HOSPITAL_COMMUNITY): Payer: Medicare Other

## 2018-06-10 DIAGNOSIS — E1169 Type 2 diabetes mellitus with other specified complication: Secondary | ICD-10-CM | POA: Diagnosis present

## 2018-06-10 DIAGNOSIS — G459 Transient cerebral ischemic attack, unspecified: Secondary | ICD-10-CM | POA: Diagnosis not present

## 2018-06-10 DIAGNOSIS — I4581 Long QT syndrome: Secondary | ICD-10-CM | POA: Diagnosis present

## 2018-06-10 DIAGNOSIS — I639 Cerebral infarction, unspecified: Secondary | ICD-10-CM | POA: Diagnosis not present

## 2018-06-10 DIAGNOSIS — R29818 Other symptoms and signs involving the nervous system: Secondary | ICD-10-CM | POA: Diagnosis not present

## 2018-06-10 DIAGNOSIS — R262 Difficulty in walking, not elsewhere classified: Secondary | ICD-10-CM | POA: Diagnosis not present

## 2018-06-10 DIAGNOSIS — Z794 Long term (current) use of insulin: Secondary | ICD-10-CM

## 2018-06-10 DIAGNOSIS — I959 Hypotension, unspecified: Secondary | ICD-10-CM | POA: Diagnosis not present

## 2018-06-10 DIAGNOSIS — Z79899 Other long term (current) drug therapy: Secondary | ICD-10-CM

## 2018-06-10 DIAGNOSIS — G9341 Metabolic encephalopathy: Secondary | ICD-10-CM | POA: Diagnosis present

## 2018-06-10 DIAGNOSIS — R630 Anorexia: Secondary | ICD-10-CM | POA: Diagnosis not present

## 2018-06-10 DIAGNOSIS — I129 Hypertensive chronic kidney disease with stage 1 through stage 4 chronic kidney disease, or unspecified chronic kidney disease: Secondary | ICD-10-CM | POA: Diagnosis not present

## 2018-06-10 DIAGNOSIS — Z741 Need for assistance with personal care: Secondary | ICD-10-CM | POA: Diagnosis not present

## 2018-06-10 DIAGNOSIS — E1139 Type 2 diabetes mellitus with other diabetic ophthalmic complication: Secondary | ICD-10-CM | POA: Diagnosis present

## 2018-06-10 DIAGNOSIS — I63522 Cerebral infarction due to unspecified occlusion or stenosis of left anterior cerebral artery: Secondary | ICD-10-CM | POA: Diagnosis not present

## 2018-06-10 DIAGNOSIS — R531 Weakness: Secondary | ICD-10-CM

## 2018-06-10 DIAGNOSIS — R2990 Unspecified symptoms and signs involving the nervous system: Secondary | ICD-10-CM

## 2018-06-10 DIAGNOSIS — N184 Chronic kidney disease, stage 4 (severe): Secondary | ICD-10-CM | POA: Diagnosis present

## 2018-06-10 DIAGNOSIS — G8191 Hemiplegia, unspecified affecting right dominant side: Secondary | ICD-10-CM | POA: Diagnosis not present

## 2018-06-10 DIAGNOSIS — Z66 Do not resuscitate: Secondary | ICD-10-CM | POA: Diagnosis not present

## 2018-06-10 DIAGNOSIS — I5030 Unspecified diastolic (congestive) heart failure: Secondary | ICD-10-CM | POA: Diagnosis not present

## 2018-06-10 DIAGNOSIS — R29713 NIHSS score 13: Secondary | ICD-10-CM | POA: Diagnosis present

## 2018-06-10 DIAGNOSIS — H3530 Unspecified macular degeneration: Secondary | ICD-10-CM | POA: Diagnosis present

## 2018-06-10 DIAGNOSIS — M2550 Pain in unspecified joint: Secondary | ICD-10-CM | POA: Diagnosis not present

## 2018-06-10 DIAGNOSIS — E1165 Type 2 diabetes mellitus with hyperglycemia: Secondary | ICD-10-CM | POA: Diagnosis not present

## 2018-06-10 DIAGNOSIS — D630 Anemia in neoplastic disease: Secondary | ICD-10-CM | POA: Diagnosis present

## 2018-06-10 DIAGNOSIS — K7200 Acute and subacute hepatic failure without coma: Secondary | ICD-10-CM | POA: Diagnosis not present

## 2018-06-10 DIAGNOSIS — I2510 Atherosclerotic heart disease of native coronary artery without angina pectoris: Secondary | ICD-10-CM | POA: Diagnosis present

## 2018-06-10 DIAGNOSIS — R1311 Dysphagia, oral phase: Secondary | ICD-10-CM | POA: Diagnosis not present

## 2018-06-10 DIAGNOSIS — E785 Hyperlipidemia, unspecified: Secondary | ICD-10-CM | POA: Diagnosis present

## 2018-06-10 DIAGNOSIS — H543 Unqualified visual loss, both eyes: Secondary | ICD-10-CM | POA: Diagnosis present

## 2018-06-10 DIAGNOSIS — Z9049 Acquired absence of other specified parts of digestive tract: Secondary | ICD-10-CM

## 2018-06-10 DIAGNOSIS — R29810 Facial weakness: Secondary | ICD-10-CM | POA: Diagnosis present

## 2018-06-10 DIAGNOSIS — H42 Glaucoma in diseases classified elsewhere: Secondary | ICD-10-CM | POA: Diagnosis present

## 2018-06-10 DIAGNOSIS — E860 Dehydration: Secondary | ICD-10-CM | POA: Diagnosis not present

## 2018-06-10 DIAGNOSIS — H409 Unspecified glaucoma: Secondary | ICD-10-CM | POA: Diagnosis present

## 2018-06-10 DIAGNOSIS — Z955 Presence of coronary angioplasty implant and graft: Secondary | ICD-10-CM

## 2018-06-10 DIAGNOSIS — E034 Atrophy of thyroid (acquired): Secondary | ICD-10-CM | POA: Diagnosis present

## 2018-06-10 DIAGNOSIS — E1122 Type 2 diabetes mellitus with diabetic chronic kidney disease: Secondary | ICD-10-CM | POA: Diagnosis not present

## 2018-06-10 DIAGNOSIS — I6602 Occlusion and stenosis of left middle cerebral artery: Secondary | ICD-10-CM | POA: Diagnosis not present

## 2018-06-10 DIAGNOSIS — R4181 Age-related cognitive decline: Secondary | ICD-10-CM | POA: Diagnosis present

## 2018-06-10 DIAGNOSIS — F4321 Adjustment disorder with depressed mood: Secondary | ICD-10-CM | POA: Diagnosis present

## 2018-06-10 DIAGNOSIS — E876 Hypokalemia: Secondary | ICD-10-CM | POA: Diagnosis not present

## 2018-06-10 DIAGNOSIS — I6389 Other cerebral infarction: Secondary | ICD-10-CM | POA: Diagnosis not present

## 2018-06-10 DIAGNOSIS — Z8249 Family history of ischemic heart disease and other diseases of the circulatory system: Secondary | ICD-10-CM

## 2018-06-10 DIAGNOSIS — R011 Cardiac murmur, unspecified: Secondary | ICD-10-CM | POA: Diagnosis present

## 2018-06-10 DIAGNOSIS — Z515 Encounter for palliative care: Secondary | ICD-10-CM | POA: Diagnosis not present

## 2018-06-10 DIAGNOSIS — Z7401 Bed confinement status: Secondary | ICD-10-CM | POA: Diagnosis not present

## 2018-06-10 DIAGNOSIS — Z79890 Hormone replacement therapy (postmenopausal): Secondary | ICD-10-CM

## 2018-06-10 DIAGNOSIS — M6281 Muscle weakness (generalized): Secondary | ICD-10-CM | POA: Diagnosis not present

## 2018-06-10 DIAGNOSIS — Z7902 Long term (current) use of antithrombotics/antiplatelets: Secondary | ICD-10-CM

## 2018-06-10 DIAGNOSIS — C9210 Chronic myeloid leukemia, BCR/ABL-positive, not having achieved remission: Secondary | ICD-10-CM | POA: Diagnosis present

## 2018-06-10 DIAGNOSIS — R627 Adult failure to thrive: Secondary | ICD-10-CM | POA: Diagnosis not present

## 2018-06-10 DIAGNOSIS — C9590 Leukemia, unspecified not having achieved remission: Secondary | ICD-10-CM | POA: Diagnosis not present

## 2018-06-10 DIAGNOSIS — I63322 Cerebral infarction due to thrombosis of left anterior cerebral artery: Secondary | ICD-10-CM | POA: Diagnosis not present

## 2018-06-10 DIAGNOSIS — E869 Volume depletion, unspecified: Secondary | ICD-10-CM | POA: Diagnosis not present

## 2018-06-10 DIAGNOSIS — H1032 Unspecified acute conjunctivitis, left eye: Secondary | ICD-10-CM | POA: Diagnosis not present

## 2018-06-10 DIAGNOSIS — R05 Cough: Secondary | ICD-10-CM | POA: Diagnosis not present

## 2018-06-10 DIAGNOSIS — Z8673 Personal history of transient ischemic attack (TIA), and cerebral infarction without residual deficits: Secondary | ICD-10-CM

## 2018-06-10 LAB — COMPREHENSIVE METABOLIC PANEL
ALT: 22 U/L (ref 0–44)
ANION GAP: 7 (ref 5–15)
AST: 23 U/L (ref 15–41)
Albumin: 3 g/dL — ABNORMAL LOW (ref 3.5–5.0)
Alkaline Phosphatase: 156 U/L — ABNORMAL HIGH (ref 38–126)
BUN: 15 mg/dL (ref 8–23)
CO2: 22 mmol/L (ref 22–32)
Calcium: 8.2 mg/dL — ABNORMAL LOW (ref 8.9–10.3)
Chloride: 110 mmol/L (ref 98–111)
Creatinine, Ser: 2.03 mg/dL — ABNORMAL HIGH (ref 0.44–1.00)
GFR, EST AFRICAN AMERICAN: 24 mL/min — AB (ref >60)
GFR, EST NON AFRICAN AMERICAN: 21 mL/min — AB (ref >60)
Glucose, Bld: 243 mg/dL — ABNORMAL HIGH (ref 70–99)
POTASSIUM: 3.7 mmol/L (ref 3.5–5.1)
SODIUM: 139 mmol/L (ref 135–145)
Total Bilirubin: 1.1 mg/dL (ref 0.3–1.2)
Total Protein: 6.3 g/dL — ABNORMAL LOW (ref 6.5–8.1)

## 2018-06-10 LAB — URINALYSIS, ROUTINE W REFLEX MICROSCOPIC
BILIRUBIN URINE: NEGATIVE
Bacteria, UA: NONE SEEN
GLUCOSE, UA: 150 mg/dL — AB
HGB URINE DIPSTICK: NEGATIVE
KETONES UR: 5 mg/dL — AB
Leukocytes, UA: NEGATIVE
NITRITE: NEGATIVE
PROTEIN: 30 mg/dL — AB
Specific Gravity, Urine: 1.018 (ref 1.005–1.030)
pH: 5 (ref 5.0–8.0)

## 2018-06-10 LAB — DIFFERENTIAL
Abs Immature Granulocytes: 0.1 10*3/uL (ref 0.0–0.1)
BASOS ABS: 0 10*3/uL (ref 0.0–0.1)
BASOS PCT: 0 %
EOS ABS: 0.1 10*3/uL (ref 0.0–0.7)
Eosinophils Relative: 2 %
Immature Granulocytes: 2 %
Lymphocytes Relative: 13 %
Lymphs Abs: 0.7 10*3/uL (ref 0.7–4.0)
MONO ABS: 0.7 10*3/uL (ref 0.1–1.0)
MONOS PCT: 12 %
NEUTROS PCT: 71 %
Neutro Abs: 3.9 10*3/uL (ref 1.7–7.7)

## 2018-06-10 LAB — CBG MONITORING, ED
GLUCOSE-CAPILLARY: 195 mg/dL — AB (ref 70–99)
Glucose-Capillary: 227 mg/dL — ABNORMAL HIGH (ref 70–99)
Glucose-Capillary: 180 mg/dL — ABNORMAL HIGH (ref 70–99)

## 2018-06-10 LAB — I-STAT TROPONIN, ED: TROPONIN I, POC: 0.02 ng/mL (ref 0.00–0.08)

## 2018-06-10 LAB — I-STAT CHEM 8, ED
BUN: 17 mg/dL (ref 8–23)
CHLORIDE: 107 mmol/L (ref 98–111)
CREATININE: 2.2 mg/dL — AB (ref 0.44–1.00)
Calcium, Ion: 1.07 mmol/L — ABNORMAL LOW (ref 1.15–1.40)
Glucose, Bld: 234 mg/dL — ABNORMAL HIGH (ref 70–99)
HEMATOCRIT: 30 % — AB (ref 36.0–46.0)
Hemoglobin: 10.2 g/dL — ABNORMAL LOW (ref 12.0–15.0)
Potassium: 3.7 mmol/L (ref 3.5–5.1)
SODIUM: 140 mmol/L (ref 135–145)
TCO2: 22 mmol/L (ref 22–32)

## 2018-06-10 LAB — CBC
HCT: 31.6 % — ABNORMAL LOW (ref 36.0–46.0)
HEMOGLOBIN: 10 g/dL — AB (ref 12.0–15.0)
MCH: 29 pg (ref 26.0–34.0)
MCHC: 31.6 g/dL (ref 30.0–36.0)
MCV: 91.6 fL (ref 78.0–100.0)
PLATELETS: 141 10*3/uL — AB (ref 150–400)
RBC: 3.45 MIL/uL — AB (ref 3.87–5.11)
RDW: 16.3 % — ABNORMAL HIGH (ref 11.5–15.5)
WBC: 5.5 10*3/uL (ref 4.0–10.5)

## 2018-06-10 LAB — PROTIME-INR
INR: 1.07
Prothrombin Time: 13.8 seconds (ref 11.4–15.2)

## 2018-06-10 LAB — APTT: APTT: 26 s (ref 24–36)

## 2018-06-10 MED ORDER — ASPIRIN 300 MG RE SUPP
300.0000 mg | Freq: Every day | RECTAL | Status: DC
  Administered 2018-06-10: 300 mg via RECTAL
  Filled 2018-06-10: qty 1

## 2018-06-10 MED ORDER — INSULIN ASPART 100 UNIT/ML ~~LOC~~ SOLN
0.0000 [IU] | Freq: Four times a day (QID) | SUBCUTANEOUS | Status: DC
  Administered 2018-06-10: 2 [IU] via SUBCUTANEOUS
  Administered 2018-06-11: 1 [IU] via SUBCUTANEOUS
  Filled 2018-06-10: qty 1

## 2018-06-10 MED ORDER — ACETAMINOPHEN 160 MG/5ML PO SOLN: 650.0000 mg | ORAL | Status: DC | PRN

## 2018-06-10 MED ORDER — BRIMONIDINE TARTRATE-TIMOLOL 0.2-0.5 % OP SOLN: 1.0000 [drp] | Freq: Two times a day (BID) | OPHTHALMIC | Status: DC

## 2018-06-10 MED ORDER — LACTATED RINGERS IV BOLUS
1000.0000 mL | Freq: Once | INTRAVENOUS | Status: AC
  Administered 2018-06-10: 1000 mL via INTRAVENOUS

## 2018-06-10 MED ORDER — ACETAMINOPHEN 650 MG RE SUPP
650.0000 mg | RECTAL | Status: DC | PRN
  Administered 2018-06-12: 650 mg via RECTAL
  Filled 2018-06-10: qty 1

## 2018-06-10 MED ORDER — ASPIRIN 325 MG PO TABS
325.0000 mg | ORAL_TABLET | Freq: Every day | ORAL | Status: DC
  Administered 2018-06-11: 325 mg via ORAL
  Filled 2018-06-10: qty 1

## 2018-06-10 MED ORDER — LATANOPROST 0.005 % OP SOLN
1.0000 [drp] | Freq: Every day | OPHTHALMIC | Status: DC
  Administered 2018-06-10 – 2018-06-12 (×3): 1 [drp] via OPHTHALMIC
  Filled 2018-06-10: qty 2.5

## 2018-06-10 MED ORDER — BRIMONIDINE TARTRATE 0.2 % OP SOLN
1.0000 [drp] | Freq: Two times a day (BID) | OPHTHALMIC | Status: DC
  Administered 2018-06-10 – 2018-06-14 (×7): 1 [drp] via OPHTHALMIC
  Filled 2018-06-10: qty 5

## 2018-06-10 MED ORDER — ACETAMINOPHEN 650 MG RE SUPP: 650.0000 mg | RECTAL | Status: DC | PRN

## 2018-06-10 MED ORDER — STROKE: EARLY STAGES OF RECOVERY BOOK
Freq: Once | Status: AC
  Administered 2018-06-10: 22:00:00
  Filled 2018-06-10: qty 1

## 2018-06-10 MED ORDER — HEPARIN SODIUM (PORCINE) 5000 UNIT/ML IJ SOLN
5000.0000 [IU] | Freq: Three times a day (TID) | INTRAMUSCULAR | Status: DC
  Administered 2018-06-10 – 2018-06-14 (×10): 5000 [IU] via SUBCUTANEOUS
  Filled 2018-06-10 (×9): qty 1

## 2018-06-10 MED ORDER — ACETAMINOPHEN 325 MG PO TABS: 650.0000 mg | ORAL_TABLET | ORAL | Status: DC | PRN

## 2018-06-10 MED ORDER — TIMOLOL MALEATE 0.5 % OP SOLN
1.0000 [drp] | Freq: Two times a day (BID) | OPHTHALMIC | Status: DC
  Administered 2018-06-10 – 2018-06-14 (×7): 1 [drp] via OPHTHALMIC
  Filled 2018-06-10: qty 5

## 2018-06-10 MED ORDER — ACETAMINOPHEN 325 MG PO TABS: 650.0000 mg | ORAL_TABLET | Freq: Four times a day (QID) | ORAL | Status: DC | PRN

## 2018-06-10 MED ORDER — SODIUM CHLORIDE 0.9 % IV SOLN
INTRAVENOUS | Status: AC
  Administered 2018-06-10: 22:00:00 via INTRAVENOUS

## 2018-06-10 MED ORDER — INSULIN GLARGINE 100 UNIT/ML ~~LOC~~ SOLN
10.0000 [IU] | Freq: Every day | SUBCUTANEOUS | Status: DC
  Administered 2018-06-10 – 2018-06-12 (×3): 10 [IU] via SUBCUTANEOUS
  Filled 2018-06-10 (×3): qty 0.1

## 2018-06-10 NOTE — ED Notes (Signed)
[facility] called to cancel code stroke  

## 2018-06-10 NOTE — Consult Note (Addendum)
Neurology Consultation  Reason for Consult: Stroke code Referring Physician: Dr. Ronnald Nian  CC: Right-sided numbness and weakness all over  History is obtained from: Patient, son and daughter-in-law at bedside, chart  HPI: Ruqayyah Lute is a 84 y.o. female past medical history of anemia, chronic myeloid leukemia, depression, diabetes, hypertension, hyperlipidemia, cognitive decline brought in from [place] facility with reporting of right arm weakness right leg weakness and right facial droop initially last seen normal 06/08/2018 but later on calling the facility said she was last seen normal at 8 AM and at about 4:30 PM is when she started complaining of weakness of the right side.  They could not corroborate a definitive last known normal. The patient lives in the facility with her husband who is severely demented.  The patient herself is blind at baseline and has some cognitive decline but less than her husband-this is according to the family was at bedside. She has required 24/7 assistance now for at least a month or 2 and has had episodes where she would not speak with the family making them think that she has been depressed lately. The family last saw her 1 week ago.  They could not tell me if at that time she had any weakness tingling numbness. She reports generalized weakness herself.  She is very slow to respond to questions.  There is some right-sided hemiparesis.  And right lower facial weakness suggestive of a stroke  LKW: To the best of history taking-8 AM on 06/10/2018 tpa given?: no, outside the window Premorbid modified Rankin scale (mRS): 4-5-requires 24/7 assistance   ROS: Unable to obtain due to altered mental status.   Past Medical History:  Diagnosis Date  . Anemia   . Chronic kidney disease   . CML (chronic myeloid leukemia) ([place])   . Depression   . Diabetes mellitus without complication ([place])   . Hyperlipidemia   . Hypertension   . Thyroid disease      Family History  Problem Relation Age of Onset  . Heart attack Father     Social History:   reports that she has never smoked. She has never used smokeless tobacco. She reports that she does not drink alcohol or use drugs.  Medications No current facility-administered medications for this encounter.   Current Outpatient Medications:  .  amLODipine (NORVASC) 10 MG tablet, Take 10 mg by mouth daily. , Disp: , Rfl:  .  carvedilol (COREG) 6.25 MG tablet, Take 6.25 mg by mouth 2 (two) times daily with a meal. , Disp: , Rfl:  .  cholecalciferol (VITAMIN D) 1000 units tablet, Take 1,000 Units by mouth daily., Disp: , Rfl:  .  clopidogrel (PLAVIX) 75 MG tablet, Take 75 mg by mouth daily. , Disp: , Rfl:  .  COMBIGAN 0.2-0.5 % ophthalmic solution, Place 1 drop into both eyes every 12 (twelve) hours. , Disp: , Rfl:  .  docusate sodium (COLACE) 100 MG capsule, Give 1 capsule by mouth daily, Disp: , Rfl:  .  ENSURE (ENSURE), Give 120 ml two times daily between meals, Disp: , Rfl:  .  escitalopram (LEXAPRO) 20 MG tablet, Take 20 mg by mouth daily. , Disp: , Rfl:  .  HUMALOG MIX 50/50 KWIKPEN (50-50) 100 UNIT/ML Kwikpen, Inject 30 Units into the skin daily. , Disp: , Rfl:  .  levothyroxine (SYNTHROID, LEVOTHROID) 50 MCG tablet, Take 50 mcg by mouth daily before breakfast. , Disp: , Rfl:  .  linagliptin (TRADJENTA) 5 MG TABS tablet,  Take 5 mg by mouth daily., Disp: , Rfl:  .  LUMIGAN 0.01 % SOLN, Place 1 drop into both eyes at bedtime. , Disp: , Rfl:  .  mirtazapine (REMERON) 7.5 MG tablet, Take 7.5 mg by mouth at bedtime., Disp: , Rfl:  .  nilotinib (TASIGNA) 200 MG capsule, Take 2 capsules (400 mg total) by mouth every 12 (twelve) hours. Give on an empty stomach 1 hour before or 2 hours after meals., Disp: 120 capsule, Rfl: 12 .  Nutritional Supplements (NUTRITIONAL SUPPLEMENT PO), Regular Diet - Regular texture - Fortified foods with all meals, Disp: , Rfl:  .  polyethylene glycol (MIRALAX /  GLYCOLAX) packet, Take 17 g by mouth daily. And as needed, Disp: , Rfl:  .  rosuvastatin (CRESTOR) 10 MG tablet, Take 10 mg by mouth daily. , Disp: , Rfl:   Exam: Current vital signs: BP (!) 148/63 (BP Location: Left Arm)   Pulse 66   Temp 98.8 F (37.1 C) (Oral)   Resp 16   Ht 5\' 1"  (1.549 m)   Wt 45.1 kg   SpO2 100%   BMI 18.79 kg/m  Vital signs in last 24 hours: Temp:  [98.8 F (37.1 C)] 98.8 F (37.1 C) (09/15 1750) Pulse Rate:  [66] 66 (09/15 1750) Resp:  [16] 16 (09/15 1750) BP: (148)/(63) 148/63 (09/15 1750) SpO2:  [100 %] 100 % (09/15 1750) Weight:  [45.1 kg] 45.1 kg (09/15 1803) General: Frail elderly woman in no apparent distress H ENT: Normocephalic atraumatic very dry mucous membranes Lungs: Clear to auscultation Cardiovascular: S1 is heard regular rate rhythm Abdomen: Soft nondistended nontender Extremities: Warm well perfused with intact peripheral pulses, no edema Neurological exam Mental status examination: Patient is awake, alert, aware that she is in the hospital, could not name the hospital, could not tell me the date, could not tell me her age.  She has a very flat affect and responds very slowly to questions. Speech is not dysarthric but just generally slow as probably bradyphrenic She would not name any objects for me but would have a conversation to me that everything hurts. She could not repeat what I asked her to repeat but could speak full sentences at her volition. Cranial nerves: She is able to respond to threat on both sides and is able to exhibit full range of extraocular movements although she is blind at baseline and has limited visual acuity, subtle right lower facial weakness, tongue midline. Motor exam: She has barely antigravity strength in the right upper and lower extremity but her arm drifts down to the bed before 20 seconds.  Both her legs drift down to the bed before 20 seconds.  The only extremity that does not drift is the left upper  extremity Sensory: Reports feeling less on the right side compared to the left Coronation: Did not perform finger-nose finger heel-knee-shin Gait testing was deferred at this time NIHSS 1a Level of Conscious.: 0 1b LOC Questions: 2 1c LOC Commands: 0 2 Best Gaze: 0 3 Visual: 0 4 Facial Palsy: 1 5a Motor Arm - left: 0 5b Motor Arm - Right: 2 6a Motor Leg - Left: 2 6b Motor Leg - Right: 2 7 Limb Ataxia: 0 8 Sensory: 1 9 Best Language: 2 10 Dysarthria: 1 11 Extinct. and Inatten.: 0 TOTAL: 13  Labs I have reviewed labs in epic and the results pertinent to this consultation are:  CBC    Component Value Date/Time   WBC 5.5 06/10/2018 1850  RBC 3.45 (L) 06/10/2018 1850   HGB 10.2 (L) 06/10/2018 1854   HGB 11.5 (L) 07/13/2017 1331   HCT 30.0 (L) 06/10/2018 1854   HCT 34.7 (L) 07/13/2017 1331   PLT 141 (L) 06/10/2018 1850   PLT 139 (L) 07/13/2017 1331   MCV 91.6 06/10/2018 1850   MCV 85.9 07/13/2017 1331   MCH 29.0 06/10/2018 1850   MCHC 31.6 06/10/2018 1850   RDW 16.3 (H) 06/10/2018 1850   RDW 17.9 (H) 07/13/2017 1331   LYMPHSABS 0.7 06/10/2018 1850   LYMPHSABS 1.1 07/13/2017 1331   MONOABS 0.7 06/10/2018 1850   MONOABS 0.7 07/13/2017 1331   EOSABS 0.1 06/10/2018 1850   EOSABS 0.5 07/13/2017 1331   BASOSABS 0.0 06/10/2018 1850   BASOSABS 0.1 07/13/2017 1331    CMP     Component Value Date/Time   NA 140 06/10/2018 1854   NA 143 05/02/2018   NA 141 07/13/2017 1331   K 3.7 06/10/2018 1854   K 4.1 07/13/2017 1331   CL 107 06/10/2018 1854   CO2 22 06/10/2018 1850   CO2 23 07/13/2017 1331   GLUCOSE 234 (H) 06/10/2018 1854   GLUCOSE 250 (H) 07/13/2017 1331   BUN 17 06/10/2018 1854   BUN 23 (A) 05/02/2018   BUN 21.0 07/13/2017 1331   CREATININE 2.20 (H) 06/10/2018 1854   CREATININE 2.1 (H) 07/13/2017 1331   CALCIUM 8.2 (L) 06/10/2018 1850   CALCIUM 9.1 07/13/2017 1331   PROT 6.3 (L) 06/10/2018 1850   PROT 7.1 07/13/2017 1331   ALBUMIN 3.0 (L) 06/10/2018 1850    ALBUMIN 3.7 07/13/2017 1331   AST 23 06/10/2018 1850   AST 14 07/13/2017 1331   ALT 22 06/10/2018 1850   ALT 6 07/13/2017 1331   ALKPHOS 156 (H) 06/10/2018 1850   ALKPHOS 68 07/13/2017 1331   BILITOT 1.1 06/10/2018 1850   BILITOT 0.42 07/13/2017 1331   GFRNONAA 21 (L) 06/10/2018 1850   GFRAA 24 (L) 06/10/2018 1850    Lipid Panel     Component Value Date/Time   CHOL 172 05/02/2018   TRIG 138 05/02/2018   HDL 49 05/02/2018   LDLCALC 96 05/02/2018     Imaging I have reviewed the images obtained: CT-scan of the brain-no acute changes, evidence of old cortical and subcortical infarction of the left frontal and left parietal regions with chronic small vessel ischemic changes elsewhere  Assessment: 84 year old woman with past medical history as above brought in for evaluation of right-sided weakness. No clear time of last seen normal-at best 8 AM this morning puts her outside the window for IV TPA. Clinically does not appear to be a large vessel stroke. Noncontrast CT of the head shows multiple old left-sided strokes. Differentials at this time are a new left-sided stroke producing right hemiparesis versus TIA versus toxic metabolic insult apathy versus recrudescence of old stroke symptoms although family is not aware that she has had any old strokes.  Recommendations: -Admit to hospitalist -Telemetry monitoring -Allow for permissive hypertension for the first 24-48h - only treat PRN if SBP >220 mmHg. Blood pressures can be gradually normalized to SBP<140 upon discharge. -MRI brain without contrast -MRA head without contrast -Carotid dopplers -Echocardiogram -HgbA1c, fasting lipid panel -Frequent neuro checks -Prophylactic therapy-Antiplatelet med: Aspirin - dose 325mg  PO or 300mg  -Atorvastatin 80 mg PO daily -Risk factor modification\ -PT consult, OT consult, Speech consult -If Afib found on telemetry, will need anticoagulation. Decision pending imaging and stroke team  rounding.  Discussed with Dr.  Curatolo in the ED. I spoke with the son and daughter-in-law at bedside, showed them images from the CAT scan showing the old strokes and small vessel disease along with atrophy.  I did explain to them that at this time, given the unclear history, she is outside the window for TPA and given no cortical exam findings as well as poor baseline modified Rankin score, not a candidate for EVT.  In addition, obtain urinalysis, ammonia, chest x-ray, B12 levels, TSH.  Please page stroke NP/PA/MD (listed on AMION)  from 8am-4 pm as this patient will be followed by the stroke team at this point   -- Amie Portland, MD [facility] Pager: [phone] If 7pm to 7am, please call on call as listed on AMION.

## 2018-06-10 NOTE — Progress Notes (Signed)
Pt arrived to unit. Moved from stretcher to bed. No c/o pain or discomfort. Welcomed and oriented to unit. Will continue to monitor.

## 2018-06-10 NOTE — H&P (Signed)
History and Physical   Stacy Hendricks GUY:[id number] DOB: August 02, 1929 DOA: 06/10/2018  PCP: Crist Infante, MD  Chief Complaint: Difficulty moving right side  History is obtained via chart review as well as family at bedside which includes the patient's son and daughter-in-law. History obtained from the patient is unable/limited due to her current state.;  HPI: this is a 84 year old woman with medical problems including coronary artery disease status post stent placement over 5 years ago, chronic kidney disease with baseline creatinine between 1.5 and 2, insulin-dependent diabetes, CML on TKI therapy, hypertension, bilateral eye blindness who presents with right sided weakness.  Family reports over the past 1-2 weeks she's had decreased by mouth intake, and nausea. She transition to a skilled nursing facility in August 2019, formerly lived in an apartment with outside hired help calm his apartment was adjacent to her son's home. She currently lives in a skilled nursing facility with her spouse who has dementia. She requires assistance for her ADLs, walks with much hand-held assistance due to her blindness. Family reports she normally recognizes loved ones, but has been increasingly more withdrawn, there is concern for depression/adjustment disorder related to her recent transition to skilled nursing facility living. Family reports a normal diet, recently started on Remeron for appetite stimulation.  Family reports she is increasingly confused compared to baseline, therefore history is unable to be reliably obtained from the patient. She was noted at her skilled nursing facility to have right sided weakness, discovered in the afternoon on the day of admission.  ED Course: in emergency department heart rate nadir 59, systolic blood pressure 563O, respiratory rate 22 but most recently normal. Due to concern for stroke, code stroke called, neurology team consulted. CT scan of the head did not reveal acute  hemorrhage,  Multiple old small vessel ischemic changes were seen.  Chest x-ray did not reveal acute cardio primary process, myocardial megaly noted.  Emergency medicine team gave the patient 1 L of lactated Ringer's, possible medicine consult further management. Other diagnostics including labs remarkable for hemoglobin of 10, normocytic. Creatinine of 2.2 which is increased baseline.  Urinalysis without overt signs of infection.  Review of Systems: A complete ROS was unable to be obtained due to the patient's confusion.  Past Medical History:  Diagnosis Date  . Anemia   . Chronic kidney disease   . CML (chronic myeloid leukemia) ([place])   . Depression   . Diabetes mellitus without complication ([place])   . Hyperlipidemia   . Hypertension   . Thyroid disease    Social History   Socioeconomic History  . Marital status: Married    Spouse name: Not on file  . Number of children: Not on file  . Years of education: Not on file  . Highest education level: Not on file  Occupational History  . Not on file  Social Needs  . Financial resource strain: Not on file  . Food insecurity:    Worry: Not on file    Inability: Not on file  . Transportation needs:    Medical: Not on file    Non-medical: Not on file  Tobacco Use  . Smoking status: Never Smoker  . Smokeless tobacco: Never Used  Substance and Sexual Activity  . Alcohol use: Never    Frequency: Never  . Drug use: Never  . Sexual activity: Not on file  Lifestyle  . Physical activity:    Days per week: Not on file    Minutes per session: Not on file  .  Stress: Not on file  Relationships  . Social connections:    Talks on phone: Not on file    Gets together: Not on file    Attends religious service: Not on file    Active member of club or organization: Not on file    Attends meetings of clubs or organizations: Not on file    Relationship status: Not on file  . Intimate partner violence:    Fear of current or ex partner: Not on  file    Emotionally abused: Not on file    Physically abused: Not on file    Forced sexual activity: Not on file  Other Topics Concern  . Not on file  Social History Narrative  . Not on file   Family History  Problem Relation Age of Onset  . Heart attack Father     Physical Exam: Vitals:   06/10/18 2015 06/10/18 2030 06/10/18 2045 06/10/18 2100  BP: (!) 162/55 (!) 160/51 (!) 162/53 (!) 158/59  Pulse: 64 63 64 65  Resp: 13 17 15 18   Temp:      TempSrc:      SpO2: 100% 100% 100% 100%  Weight:      Height:       General: Appears calm and comfortable, elderly frail white woman, [place] ENT: Blind, hearing WNL, dry mucus membranes Cardiovascular: RRR. 2/6 murmur.  No edema Respiratory: CTA bilaterally anterior region. No wheezes or crackles. Normal respiratory effort. Abdomen: Soft, non-tender. No rebound or guarding. Skin: No rash or induration seen on limited exam. Age related skin thinning present. Musculoskeletal: Grossly normal tone BUE/BLE. Appropriate ROM.  Psychiatric: Flat affect, appears withdrawn Neurologic: Does not move proximal RUE and RLE against gravity, Right sided facial droop present, not oriented to year  I have personally reviewed the following labs, culture data, and imaging studies.  Assessment/Plan:  #CVA, acute, suspected Course: sought medical attention from SNF, found to have right sided weakness and right sided facial droop; CT head without hemorrhage; although no known hx of CVA, brain imaging revealed evidence of prior ischemic changes; neurology team consulted - recommended admission and stroke work up as well as evaluation for other acute medical problems A/P:  Will provide ASA, NPO until safe swallow status ensured (or alternative route of administration), PT/OT, MRI of brain, carotid dopplers, TTE, and telemetry to further eval etiology and evaluate, permissive HTN, risk stratification - A1c + lipid profile  #Other problems: -Encephalopathy:  may be related to dehydration, stroke, underlying cognitive disorder, depression - will evaluate with B12, TSH, provide hydration with NS at 100 cc q h x 12 hours and then re-assess -Insulin dep DM: last A1c in 04/2018 of 6.7, will provide rapid acting corrective insulin q 6 hours, basal insulin 10 units to start tomorrow - adjust if needed based on trend -HTN and hx of CAD: was on BB, CCB, statin, plavix PTA, holding currently given NPO status -CKD with baseline Cr of ~1.5-2: on admission Cr of 2.2, providing hydration, continue to re-evaluate -Anemia: may be related to CKD, on admission, slightly decreased from prior at hb of 10, checking retic, iron parameters to further elucidate  -CML: followed by onc outpatient, on nilotinib 200 mg po q 12, hold until able to take PO, did have detectable CML transcript at last value -Blindness / glaucoma: continue eye gtts -Depression / adjustment disorder: on SSRI therapy, re-start when able -Decreased PO intake: consider re-start of Remeron when po established, was recently started due to decreased  PO intake at SNF   DVT prophylaxis: Subq Hep Code Status: DNR / do not intubate; son confirmed this on admission Disposition Plan: Anticipate D/C to SNF in 2-5 days Consults called: neurology team Admission status: admit to telemetry floor, hospital medicine service   Cheri Rous, MD [facility] [phone]  If 7PM-7AM, please contact night-coverage [email or web address] Password [email or web address]  This document was created using the aid of voice recognition / dication software, please excuse any sound alike substitutions, typographic, or transcription errors. Efforts have been made to correct these errors, however some may persist, and this does not reflect the standard of medical care. If there are any questions please do not hesitate to contact me for clarification.

## 2018-06-10 NOTE — ED Provider Notes (Signed)
[facility] EMERGENCY DEPARTMENT Provider Note   CSN: [id number] Arrival date & time: 06/10/18  1748     History   Chief Complaint Chief Complaint  Patient presents with  . Weakness    HPI Stacy Hendricks is a 84 y.o. female.  Level 5 caveat due to altered mental status.  History is obtained from nursing home and from family.  Patient sent here for concern for stroke.  Last known normal likely around 8 AM.  Patient called out to staff for right-sided weakness and numbness at 430 PM.  Patient at that time was found to not be able to move her right upper extremity and her right lower extremity.  Patient has leukemia but has no history of strokes.  Code stroke called upon arrival. Blind in both eyes.  The history is provided by the nursing home and a caregiver.  Neurologic Problem  This is a new problem. The current episode started 12 to 24 hours ago. The problem occurs hourly. The problem has not changed since onset.Pertinent negatives include no chest pain, no abdominal pain, no headaches and no shortness of breath. Nothing aggravates the symptoms. Nothing relieves the symptoms. She has tried nothing for the symptoms. The treatment provided no relief.    Past Medical History:  Diagnosis Date  . Anemia   . Chronic kidney disease   . CML (chronic myeloid leukemia) ([place])   . Depression   . Diabetes mellitus without complication ([place])   . Hyperlipidemia   . Hypertension   . Thyroid disease     Patient Active Problem List   Diagnosis Date Noted  . CVA (cerebral vascular accident) ([place]) 06/10/2018  . CKD stage 4 due to type 2 diabetes mellitus ([place]) 05/17/2018  . Interstitial lung disease ([place]) 05/03/2018  . Constipation 05/03/2018  . Hypertension associated with diabetes ([place]) 05/02/2018  . Dyslipidemia associated with type 2 diabetes mellitus ([place]) 05/02/2018  . Insulin dependent type 2 diabetes mellitus, controlled ([place]) 05/02/2018  . Hypertension  associated with stage 4 chronic kidney disease due to type 2 diabetes mellitus ([place]) 05/02/2018  . Hypothyroidism due to acquired atrophy of thyroid 05/02/2018  . Glaucoma due to type 2 diabetes mellitus ([place]) 05/02/2018  . Depression, recurrent ([place]) 05/02/2018  . CAD S/P percutaneous coronary angioplasty 09/27/2014  . Macular degeneration of both eyes 09/27/2014  . Anemia in neoplastic disease 09/27/2014  . CML (chronic myeloid leukemia) ([place]) 09/16/2014    Past Surgical History:  Procedure Laterality Date  . APPENDECTOMY    . PTCA       OB History   None      Home Medications    Prior to Admission medications   Medication Sig Start Date End Date Taking? Authorizing Provider  amLODipine (NORVASC) 10 MG tablet Take 10 mg by mouth daily.  05/02/18  Yes [provider]  carvedilol (COREG) 6.25 MG tablet Take 6.25 mg by mouth 2 (two) times daily with a meal.  05/30/18  Yes [provider]  clopidogrel (PLAVIX) 75 MG tablet Take 75 mg by mouth daily.    Yes [provider]  COMBIGAN 0.2-0.5 % ophthalmic solution Place 1 drop into both eyes every 12 (twelve) hours.  09/23/14  Yes [provider]  docusate sodium (COLACE) 100 MG capsule Take 100 mg by mouth daily. Give 1 capsule by mouth daily  05/04/18  Yes [provider]  ENSURE (ENSURE) Give 120 ml two times daily between meals 06/04/18  Yes [provider]  escitalopram (LEXAPRO) 20 MG tablet Take 20 mg by mouth daily.  05/09/18  Yes [provider]  HUMALOG MIX 50/50 KWIKPEN (50-50) 100 UNIT/ML Kwikpen Inject 20 Units into the skin daily. Hold for cbg <100 08/28/14  Yes [provider]  insulin glargine (LANTUS) 100 UNIT/ML injection Inject 10 Units into the skin at bedtime.   Yes [provider]  levothyroxine (SYNTHROID, LEVOTHROID) 50 MCG tablet Take 50 mcg by mouth daily before breakfast.  09/20/14  Yes [provider]  linagliptin (TRADJENTA) 5  MG TABS tablet Take 5 mg by mouth daily.   Yes [provider]  LUMIGAN 0.01 % SOLN Place 1 drop into both eyes at bedtime.  09/23/14  Yes [provider]  mirtazapine (REMERON) 7.5 MG tablet Take 7.5 mg by mouth at bedtime. 05/30/18  Yes [provider]  nilotinib (TASIGNA) 200 MG capsule Take 2 capsules (400 mg total) by mouth every 12 (twelve) hours. Give on an empty stomach 1 hour before or 2 hours after meals. 04/30/18  Yes Magrinat, Virgie Dad, MD  polyethylene glycol (MIRALAX / GLYCOLAX) packet Take 17 g by mouth daily. And as needed 05/04/18  Yes [provider]  rosuvastatin (CRESTOR) 10 MG tablet Take 10 mg by mouth daily.    Yes [provider]    Family History Family History  Problem Relation Age of Onset  . Heart attack Father     Social History Social History   Tobacco Use  . Smoking status: Never Smoker  . Smokeless tobacco: Never Used  Substance Use Topics  . Alcohol use: Never    Frequency: Never  . Drug use: Never     Allergies   Patient has no known allergies.   Review of Systems Review of Systems  Unable to perform ROS: Mental status change  Respiratory: Negative for shortness of breath.   Cardiovascular: Negative for chest pain.  Gastrointestinal: Negative for abdominal pain.  Neurological: Negative for headaches.     Physical Exam Updated Vital Signs  ED Triage Vitals  Enc Vitals Group     BP 06/10/18 1750 (!) 148/63     Pulse Rate 06/10/18 1750 66     Resp 06/10/18 1750 16     Temp 06/10/18 1750 98.8 F (37.1 C)     Temp Source 06/10/18 1750 Oral     SpO2 06/10/18 1750 100 %     Weight 06/10/18 1803 99 lb 6.8 oz (45.1 kg)     Height 06/10/18 1803 5\' 1"  (1.549 m)     Head Circumference --      Peak Flow --      Pain Score 06/10/18 1752 0     Pain Loc --      Pain Edu? --      Excl. in [place]? --     Physical Exam  Constitutional: She appears well-developed and well-nourished. She appears  distressed.  HENT:  Head: Normocephalic and atraumatic.  Eyes: Conjunctivae and EOM are normal.  Pupils are not reactive as patient is blind  Neck: Normal range of motion. Neck supple.  Cardiovascular: Normal rate and regular rhythm.  No murmur heard. Pulmonary/Chest: Effort normal and breath sounds normal. No respiratory distress.  Abdominal: Soft. There is no tenderness.  Musculoskeletal: Normal range of motion. She exhibits no edema.  Neurological: She is alert.  Patient with 1+ out of 5 strength in the right upper extremity and right lower extremity, right-sided facial droop, 5+ out of  5 strength in the left upper and left lower extremity, patient follows commands and patient is unable to participate in finger-nose-finger, has no drift on the left upper extremity and right arm is weak and unable to participate in testing  Skin: Skin is warm and dry.  Psychiatric: She has a normal mood and affect.  Nursing note and vitals reviewed.    ED Treatments / Results  Labs (all labs ordered are listed, but only abnormal results are displayed) Labs Reviewed  CBC - Abnormal; Notable for the following components:      Result Value   RBC 3.45 (*)    Hemoglobin 10.0 (*)    HCT 31.6 (*)    RDW 16.3 (*)    Platelets 141 (*)    All other components within normal limits  COMPREHENSIVE METABOLIC PANEL - Abnormal; Notable for the following components:   Glucose, Bld 243 (*)    Creatinine, Ser 2.03 (*)    Calcium 8.2 (*)    Total Protein 6.3 (*)    Albumin 3.0 (*)    Alkaline Phosphatase 156 (*)    GFR calc non Af Amer 21 (*)    GFR calc Af Amer 24 (*)    All other components within normal limits  URINALYSIS, ROUTINE W REFLEX MICROSCOPIC - Abnormal; Notable for the following components:   Color, Urine AMBER (*)    APPearance HAZY (*)    Glucose, UA 150 (*)    Ketones, ur 5 (*)    Protein, ur 30 (*)    All other components within normal limits  CBG MONITORING, ED - Abnormal; Notable for  the following components:   Glucose-Capillary 227 (*)    All other components within normal limits  I-STAT CHEM 8, ED - Abnormal; Notable for the following components:   Creatinine, Ser 2.20 (*)    Glucose, Bld 234 (*)    Calcium, Ion 1.07 (*)    Hemoglobin 10.2 (*)    HCT 30.0 (*)    All other components within normal limits  CBG MONITORING, ED - Abnormal; Notable for the following components:   Glucose-Capillary 195 (*)    All other components within normal limits  CBG MONITORING, ED - Abnormal; Notable for the following components:   Glucose-Capillary 180 (*)    All other components within normal limits  PROTIME-INR  APTT  DIFFERENTIAL  HEMOGLOBIN A1C  LIPID PANEL  AMMONIA  TSH  VITAMIN B12  CBC  COMPREHENSIVE METABOLIC PANEL  RETICULOCYTES  IRON AND TIBC  FERRITIN  I-STAT TROPONIN, ED  I-STAT CHEM 8, ED    EKG EKG Interpretation  Date/Time:  Sunday June 10 2018 17:54:31 EDT Ventricular Rate:  78 PR Interval:    QRS Duration: 107 QT Interval:  492 QTC Calculation: 561 R Axis:   -3 Text Interpretation:  Sinus rhythm Borderline abnrm T, anterolateral leads Prolonged QT interval Confirmed by Lennice Sites [phone]) on 06/10/2018 6:35:17 PM   Radiology Dg Chest Portable 1 View  Result Date: 06/10/2018 CLINICAL DATA:  Cough. EXAM: PORTABLE CHEST 1 VIEW COMPARISON:  None. FINDINGS: Patient is rotated to the left. Lungs are adequately inflated without consolidation or effusion. Mild increased density over the midline neck base at the level of the sternal notch likely due to goiter. Mild cardiomegaly. Degenerative change of the spine. IMPRESSION: No acute cardiopulmonary disease. Mild cardiomegaly. Electronically Signed   By: Marin Olp M.D.   On: 06/10/2018 20:02   Ct Head Code Stroke Wo Contrast  Result Date:  06/10/2018 CLINICAL DATA:  Code stroke.  Generalized weakness and dysesthesias. EXAM: CT HEAD WITHOUT CONTRAST TECHNIQUE: Contiguous axial images were  obtained from the base of the skull through the vertex without intravenous contrast. COMPARISON:  None. FINDINGS: Brain: The brainstem shows chronic small-vessel change of the pons. No focal cerebellar insult. Cerebral hemispheres show generalized atrophy. There chronic small-vessel ischemic changes of the white matter. No large vessel stroke seen in the right hemisphere. On the left, there is old cortical and subcortical infarction in the posterior frontal and the parietal region. No identifiable acute infarction. No mass lesion, hemorrhage, hydrocephalus or extra-axial collection. Vascular: There is atherosclerotic calcification of the major vessels at the base of the brain. Skull: Negative Sinuses/Orbits: Clear/normal Other: None ASPECTS: Unable to apply in the setting of old infarctions on the left and without lateralizing findings in the history. IMPRESSION: 1. No acute finding by CT. Atrophy. Old cortical and subcortical infarctions in the left frontal and left parietal regions. Chronic small-vessel ischemic changes elsewhere. 2. ASPECTS is not applicable given the absence of lateralizing signs and the extensive chronic changes. 3. These results were communicated to Dr. Rory Percy At 7:13 pmon 9/15/2019by text page via the [facility] messaging system. Electronically Signed   By: Nelson Chimes M.D.   On: 06/10/2018 19:14    Procedures Procedures (including critical care time)  Medications Ordered in ED Medications  latanoprost (XALATAN) 0.005 % ophthalmic solution 1 drop (has no administration in time range)  0.9 %  sodium chloride infusion ( Intravenous New Bag/Given 06/10/18 2143)  heparin injection 5,000 Units (5,000 Units Subcutaneous Given 06/10/18 2141)  aspirin suppository 300 mg (300 mg Rectal Given 06/10/18 2137)    Or  aspirin tablet 325 mg ( Oral See Alternative 06/10/18 2137)  acetaminophen (TYLENOL) tablet 650 mg (has no administration in time range)    Or  acetaminophen (TYLENOL) solution 650 mg  (has no administration in time range)    Or  acetaminophen (TYLENOL) suppository 650 mg (has no administration in time range)  insulin glargine (LANTUS) injection 10 Units (has no administration in time range)  insulin aspart (novoLOG) injection 0-9 Units (2 Units Subcutaneous Given 06/10/18 2140)  brimonidine (ALPHAGAN) 0.2 % ophthalmic solution 1 drop (has no administration in time range)    And  timolol (TIMOPTIC) 0.5 % ophthalmic solution 1 drop (has no administration in time range)  lactated ringers bolus 1,000 mL (0 mLs Intravenous Stopped 06/10/18 2045)   stroke: mapping our early stages of recovery book ( Does not apply Given 06/10/18 2144)     Initial Impression / Assessment and Plan / ED Course  I have reviewed the triage vital signs and the nursing notes.  Pertinent labs & imaging results that were available during my care of the patient were reviewed by me and considered in my medical decision making (see chart for details).     Stacy Hendricks is an 84 year old female history of diabetes, hypertension, high cholesterol, CML who presents to the ED with strokelike symptoms.  Patient with unremarkable vitals.  No fever.  Last known normal possibly at 8 AM.  Patient according to nursing home called out for weakness and numbness to her right arm and right leg.  She has severe depression and is overall difficult to interview.  She appears altered.  Nursing home states that it was around 4:30 PM when they noticed this and patient was sent for evaluation.  Upon my evaluation family members are at the bedside and state that patient  has no history of stroke.  Typically is able to move her right side.  She has right upper and right lower extremity weakness as well as right-sided facial droop.  Code stroke was initiated and Dr. Malen Gauze with neurology came down to evaluate the patient.  No acute stroke was seen on the head CT.  She is not a candidate for TPA due to being outside of the window and  would not be a candidate for intervention given her multiple comorbidities.  Lab work showed no significant anemia, electrolyte abnormality.  Creatinine at baseline.  IV fluids given as patient appears dehydrated.  EKG shows sinus rhythm.  Troponin within normal limits.  Doubt cardiac process.  Patient to be admitted for further stroke work-up/encephalopathy work-up.  No signs of pneumonia or urinary tract infection.  Remained hemodynamically stable throughout my care and was admitted to medicine for further care.  This chart was dictated using voice recognition software.  Despite best efforts to proofread,  errors can occur which can change the documentation meaning.   Final Clinical Impressions(s) / ED Diagnoses   Final diagnoses:  Weakness  Stroke-like symptoms    ED Discharge Orders    None       Lennice Sites, DO 06/10/18 2248

## 2018-06-10 NOTE — ED Triage Notes (Signed)
Pt BIB [facility] from [place], staff reports pt has right arm weakness, right leg paralysis, and right facial droop. LSN Friday 9/13. Pt alert and oriented to self, which is baseline. Pt completely blind. EMS vitals: BP 152/46, P-74, RR-18, SpO2-98% room air, CBG 308

## 2018-06-11 ENCOUNTER — Inpatient Hospital Stay (HOSPITAL_COMMUNITY): Payer: Medicare Other

## 2018-06-11 DIAGNOSIS — R630 Anorexia: Secondary | ICD-10-CM

## 2018-06-11 DIAGNOSIS — R627 Adult failure to thrive: Secondary | ICD-10-CM

## 2018-06-11 DIAGNOSIS — E876 Hypokalemia: Secondary | ICD-10-CM

## 2018-06-11 DIAGNOSIS — C9210 Chronic myeloid leukemia, BCR/ABL-positive, not having achieved remission: Secondary | ICD-10-CM

## 2018-06-11 DIAGNOSIS — I639 Cerebral infarction, unspecified: Secondary | ICD-10-CM

## 2018-06-11 DIAGNOSIS — E869 Volume depletion, unspecified: Secondary | ICD-10-CM

## 2018-06-11 DIAGNOSIS — I5030 Unspecified diastolic (congestive) heart failure: Secondary | ICD-10-CM

## 2018-06-11 DIAGNOSIS — I63322 Cerebral infarction due to thrombosis of left anterior cerebral artery: Secondary | ICD-10-CM

## 2018-06-11 DIAGNOSIS — K7200 Acute and subacute hepatic failure without coma: Secondary | ICD-10-CM

## 2018-06-11 LAB — COMPREHENSIVE METABOLIC PANEL
ALT: 20 U/L (ref 0–44)
ANION GAP: 8 (ref 5–15)
AST: 21 U/L (ref 15–41)
Albumin: 2.7 g/dL — ABNORMAL LOW (ref 3.5–5.0)
Alkaline Phosphatase: 140 U/L — ABNORMAL HIGH (ref 38–126)
BUN: 12 mg/dL (ref 8–23)
CO2: 24 mmol/L (ref 22–32)
CREATININE: 1.73 mg/dL — AB (ref 0.44–1.00)
Calcium: 8.1 mg/dL — ABNORMAL LOW (ref 8.9–10.3)
Chloride: 109 mmol/L (ref 98–111)
GFR, EST AFRICAN AMERICAN: 29 mL/min — AB (ref >60)
GFR, EST NON AFRICAN AMERICAN: 25 mL/min — AB (ref >60)
Glucose, Bld: 151 mg/dL — ABNORMAL HIGH (ref 70–99)
POTASSIUM: 3.2 mmol/L — AB (ref 3.5–5.1)
SODIUM: 141 mmol/L (ref 135–145)
Total Bilirubin: 0.9 mg/dL (ref 0.3–1.2)
Total Protein: 5.6 g/dL — ABNORMAL LOW (ref 6.5–8.1)

## 2018-06-11 LAB — FERRITIN: Ferritin: 115 ng/mL (ref 11–307)

## 2018-06-11 LAB — ECHOCARDIOGRAM COMPLETE
Height: 64 in
WEIGHTICAEL: 1862.45 [oz_av]

## 2018-06-11 LAB — LIPID PANEL
CHOL/HDL RATIO: 2.9 ratio
CHOLESTEROL: 108 mg/dL (ref 0–200)
HDL: 37 mg/dL — AB (ref >40)
LDL CALC: 43 mg/dL (ref 0–99)
TRIGLYCERIDES: 139 mg/dL (ref <150)
VLDL: 28 mg/dL (ref 0–40)

## 2018-06-11 LAB — GLUCOSE, CAPILLARY
GLUCOSE-CAPILLARY: 170 mg/dL — AB (ref 70–99)
GLUCOSE-CAPILLARY: 145 mg/dL — AB (ref 70–99)
Glucose-Capillary: 146 mg/dL — ABNORMAL HIGH (ref 70–99)
Glucose-Capillary: 113 mg/dL — ABNORMAL HIGH (ref 70–99)

## 2018-06-11 LAB — CBC
HCT: 28 % — ABNORMAL LOW (ref 36.0–46.0)
Hemoglobin: 9.1 g/dL — ABNORMAL LOW (ref 12.0–15.0)
MCH: 28.9 pg (ref 26.0–34.0)
MCHC: 32.5 g/dL (ref 30.0–36.0)
MCV: 88.9 fL (ref 78.0–100.0)
Platelets: 144 10*3/uL — ABNORMAL LOW (ref 150–400)
RBC: 3.15 MIL/uL — ABNORMAL LOW (ref 3.87–5.11)
RDW: 16.1 % — AB (ref 11.5–15.5)
WBC: 4.2 10*3/uL (ref 4.0–10.5)

## 2018-06-11 LAB — RETICULOCYTES
RBC.: 3.15 MIL/uL — AB (ref 3.87–5.11)
Retic Count, Absolute: 59.9 10*3/uL (ref 19.0–186.0)
Retic Ct Pct: 1.9 % (ref 0.4–3.1)

## 2018-06-11 LAB — MRSA PCR SCREENING: MRSA BY PCR: NEGATIVE

## 2018-06-11 LAB — AMMONIA: Ammonia: 17 umol/L (ref 9–35)

## 2018-06-11 LAB — IRON AND TIBC
Iron: 53 ug/dL (ref 28–170)
Saturation Ratios: 24 % (ref 10.4–31.8)
TIBC: 221 ug/dL — ABNORMAL LOW (ref 250–450)
UIBC: 168 ug/dL

## 2018-06-11 LAB — TSH: TSH: 0.254 u[IU]/mL — AB (ref 0.350–4.500)

## 2018-06-11 LAB — VITAMIN B12: Vitamin B-12: 576 pg/mL (ref 180–914)

## 2018-06-11 MED ORDER — CLOPIDOGREL BISULFATE 75 MG PO TABS
75.0000 mg | ORAL_TABLET | Freq: Every day | ORAL | Status: DC
  Administered 2018-06-12 – 2018-06-14 (×3): 75 mg via ORAL
  Filled 2018-06-11 (×3): qty 1

## 2018-06-11 MED ORDER — CHLORHEXIDINE GLUCONATE 0.12 % MT SOLN
15.0000 mL | Freq: Two times a day (BID) | OROMUCOSAL | Status: DC
  Administered 2018-06-11 – 2018-06-13 (×6): 15 mL via OROMUCOSAL
  Filled 2018-06-11 (×7): qty 15

## 2018-06-11 MED ORDER — POTASSIUM CHLORIDE CRYS ER 20 MEQ PO TBCR
40.0000 meq | EXTENDED_RELEASE_TABLET | Freq: Once | ORAL | Status: AC
  Administered 2018-06-11: 40 meq via ORAL
  Filled 2018-06-11: qty 2

## 2018-06-11 MED ORDER — SODIUM CHLORIDE 0.9 % IV SOLN
INTRAVENOUS | Status: AC
  Administered 2018-06-11 (×2): via INTRAVENOUS

## 2018-06-11 MED ORDER — INSULIN ASPART 100 UNIT/ML ~~LOC~~ SOLN
0.0000 [IU] | Freq: Three times a day (TID) | SUBCUTANEOUS | Status: DC
  Administered 2018-06-11: 1 [IU] via SUBCUTANEOUS

## 2018-06-11 MED ORDER — ORAL CARE MOUTH RINSE
15.0000 mL | Freq: Two times a day (BID) | OROMUCOSAL | Status: DC
  Administered 2018-06-11 – 2018-06-13 (×5): 15 mL via OROMUCOSAL

## 2018-06-11 MED ORDER — ASPIRIN EC 81 MG PO TBEC
81.0000 mg | DELAYED_RELEASE_TABLET | Freq: Every day | ORAL | Status: DC
  Administered 2018-06-12 – 2018-06-14 (×3): 81 mg via ORAL
  Filled 2018-06-11 (×3): qty 1

## 2018-06-11 NOTE — Evaluation (Signed)
Speech Language Pathology Evaluation Patient Details Name: Stacy Hendricks MRN: [id number] DOB: [birth year] Today's Date: 06/11/2018 Time: 1030-1050 SLP Time Calculation (min) (ACUTE ONLY): 20 min  Problem List:  Patient Active Problem List   Diagnosis Date Noted  . CVA (cerebral vascular accident) ([place]) 06/10/2018  . CKD stage 4 due to type 2 diabetes mellitus ([place]) 05/17/2018  . Interstitial lung disease ([place]) 05/03/2018  . Constipation 05/03/2018  . Hypertension associated with diabetes ([place]) 05/02/2018  . Dyslipidemia associated with type 2 diabetes mellitus ([place]) 05/02/2018  . Insulin dependent type 2 diabetes mellitus, controlled ([place]) 05/02/2018  . Hypertension associated with stage 4 chronic kidney disease due to type 2 diabetes mellitus ([place]) 05/02/2018  . Hypothyroidism due to acquired atrophy of thyroid 05/02/2018  . Glaucoma due to type 2 diabetes mellitus ([place]) 05/02/2018  . Depression, recurrent ([place]) 05/02/2018  . CAD S/P percutaneous coronary angioplasty 09/27/2014  . Macular degeneration of both eyes 09/27/2014  . Anemia in neoplastic disease 09/27/2014  . CML (chronic myeloid leukemia) ([place]) 09/16/2014   Past Medical History:  Past Medical History:  Diagnosis Date  . Anemia   . Chronic kidney disease   . CML (chronic myeloid leukemia) ([place])   . Depression   . Diabetes mellitus without complication ([place])   . Hyperlipidemia   . Hypertension   . Thyroid disease    Past Surgical History:  Past Surgical History:  Procedure Laterality Date  . APPENDECTOMY    . PTCA     HPI:  84 year old female admitted 06/10/18 with difficulty moving her right side, decline in po intake. PMH: CAD, CKD, IDDM, CML, HTN, bilateral blindness. MRI = acute multifocal L ACA infarcts, old L MCA, R temporal, L cerebellar infarcts.   Assessment / Plan / Recommendation Clinical Impression  Informal assessment completed, as pt declined participation in standardized assessment (Mo[facility]  blind), stating "I'm normal". Pt speech is fully intelligible. She is able to follow one step commands and answer questions accurately. No apparent difficulty with word retrieval. Pt was able to state that we are in [place], and that she is from [place], but was unable to provide other orientation information accurately. She was unable to recall 3 words immediately. No family was present during this assessment to clarify baseline level of function. Will defer skilled treatment to next venue, where needs may be addressed in pt familiar environment.    SLP Assessment  SLP Recommendation/Assessment: All further Speech Language Pathology needs can be addressed in the next venue of care SLP Visit Diagnosis: Cognitive communication deficit (R41.841)    Follow Up Recommendations  24 hour supervision/assistance       SLP Evaluation Cognition  Overall Cognitive Status: Impaired/Different from baseline Arousal/Alertness: Awake/alert Orientation Level: Oriented to person;Disoriented to place;Disoriented to time;Disoriented to situation Attention: Focused;Sustained Focused Attention: Appears intact Sustained Attention: Impaired Sustained Attention Impairment: Verbal basic;Functional basic Memory: Impaired Memory Impairment: Storage deficit;Retrieval deficit;Decreased recall of new information;Decreased short term memory Decreased Short Term Memory: Verbal basic       Comprehension  Auditory Comprehension Overall Auditory Comprehension: Appears within functional limits for tasks assessed Yes/No Questions: Within Functional Limits Commands: Impaired One Step Basic Commands: 75-100% accurate Two Step Basic Commands: 50-74% accurate Conversation: Simple    Expression Expression Primary Mode of Expression: Verbal Verbal Expression Overall Verbal Expression: Appears within functional limits for tasks assessed   Oral / Motor  Oral Motor/Sensory Function Overall Oral Motor/Sensory Function:  Within functional limits Motor Speech Overall Motor Speech: Appears within  functional limits for tasks assessed   GO                   Reverie Vaquera B. Quentin Ore [facility], CCC-SLP Speech Language Pathologist [phone]  Shonna Chock 06/11/2018, 11:37 AM

## 2018-06-11 NOTE — Evaluation (Signed)
Occupational Therapy Evaluation Patient Details Name: Stacy Hendricks MRN: [id number] DOB: 1929/06/18 Today's Date: 06/11/2018    History of Present Illness 84 year old woman with medical problems including CAD status post stent placement CKD, insulin-dependent DM, CML on TKI therapy, hypertension, bilateral eye blindness who presents with right sided weakness. No presents with acute left ACA versus ACA/MCA watershed stroke   Clinical Impression   PTA, pt was at [facility] but she is not able to provide details concerning PLOF. Pt presenting to OT session with decreased functional use of R UE although she is able to incorporate it into functional tasks with noted poor coordination, decreased attention, decreased awareness, and decreased activity tolerance for ADL. She was able to minimally follow commands. Pt would best benefit from SNF level rehabilitation post-acute D/C to maximize participation in mobility and self-care tasks. OT will continue to follow while admitted.     Follow Up Recommendations  SNF;Supervision/Assistance - 24 hour    Equipment Recommendations  Other (comment)(defer to next venue of care)    Recommendations for Other Services       Precautions / Restrictions Precautions Precautions: Fall Restrictions Weight Bearing Restrictions: No      Mobility Bed Mobility Overal bed mobility: Needs Assistance Bed Mobility: Rolling;Supine to Sit;Sit to Supine Rolling: Mod assist   Supine to sit: Max assist Sit to supine: Max assist;+2 for safety/equipment   General bed mobility comments: Moderate assist to roll to the right, patient able to utiize LUE to reach and grab for rails. Max assist to elevate trunk to upright at EOB and Max assist for controlled descent back to supine with +2 for repositioning  Transfers Overall transfer level: Needs assistance Equipment used: 2 person hand held assist Transfers: Sit to/from Stand Sit to Stand: Max assist;+2 physical  assistance         General transfer comment: Max assist to power up (2 persons) face to face with chuck pad. Unable to reach full upright postiion. RLE blocked out by therapist during transition.    Balance Overall balance assessment: Needs assistance Sitting-balance support: Feet supported Sitting balance-Leahy Scale: Poor Sitting balance - Comments: assist for therapist to maintain sitting balance at EOB. Tolerate ~6 minutes at EOB Postural control: Right lateral lean   Standing balance-Leahy Scale: Zero                             ADL either performed or assessed with clinical judgement   ADL Overall ADL's : Needs assistance/impaired Eating/Feeding: Total assistance   Grooming: Moderate assistance;Sitting;Wash/dry face Grooming Details (indicate cue type and reason): hand over hand assist to initiate face washing Upper Body Bathing: Total assistance   Lower Body Bathing: Total assistance   Upper Body Dressing : Total assistance   Lower Body Dressing: Total assistance   Toilet Transfer: Total assistance   Toileting- Clothing Manipulation and Hygiene: Total assistance       Functional mobility during ADLs: Total assistance;+2 for physical assistance General ADL Comments: Pt able to sit at EOB for preperatory activities. With functional tasks, she is able to activate R UE but not on command. Able to wash face with assistance to initiate tasks.      Vision Baseline Vision/History: Legally blind Patient Visual Report: (History of blindness per chart. ) Vision Assessment?: Vision impaired- to be further tested in functional context Additional Comments: Pt with history of blindness.      Perception     Praxis Praxis Praxis  tested?: Deficits Deficits: Ideomotor    Pertinent Vitals/Pain Pain Assessment: Faces Faces Pain Scale: Hurts little more Pain Location: head and generalized Pain Descriptors / Indicators: Grimacing;Moaning Pain Intervention(s):  Monitored during session;Repositioned     Hand Dominance     Extremity/Trunk Assessment Upper Extremity Assessment Upper Extremity Assessment: Generalized weakness RUE Deficits / Details: Some apraxia RUE, able to utilize and move during functional task performance but unable to activate upon command RUE Coordination: decreased fine motor;decreased gross motor   Lower Extremity Assessment Lower Extremity Assessment: Generalized weakness RLE Deficits / Details: NO activation of RLE even with functional task performance. Noted flaccidity RLE Coordination: decreased fine motor;decreased gross motor   Cervical / Trunk Assessment Cervical / Trunk Assessment: Kyphotic   Communication Communication Communication: Expressive difficulties;Receptive difficulties   Cognition Arousal/Alertness: Awake/alert Behavior During Therapy: Flat affect Overall Cognitive Status: Impaired/Different from baseline Area of Impairment: Orientation;Attention;Memory;Following commands;Safety/judgement;Awareness;Problem solving                 Orientation Level: Disoriented to;Place;Time;Situation Current Attention Level: Sustained Memory: Decreased short-term memory Following Commands: Follows one step commands inconsistently;Follows one step commands with increased time Safety/Judgement: Decreased awareness of safety;Decreased awareness of deficits Awareness: Intellectual Problem Solving: Slow processing;Decreased initiation;Difficulty sequencing;Requires verbal cues;Requires tactile cues General Comments: Pt stating "I don't understand" when asked where she is from. Able to follow some commands. She required hand over hand assistance to initiate tasks today.    General Comments  BP 159/66 with HR 62    Exercises Exercises: Other exercises Other Exercises Other Exercises: PROM RUE    Shoulder Instructions      Home Living Family/patient expects to be discharged to:: Skilled nursing facility                                  Additional Comments: Pt has been at [facility]      Prior Functioning/Environment Level of Independence: Needs assistance        Comments: patient unable to provide details        OT Problem List: Decreased strength;Decreased range of motion;Decreased activity tolerance;Impaired balance (sitting and/or standing);Decreased safety awareness;Decreased knowledge of use of DME or AE;Decreased knowledge of precautions;Decreased cognition;Impaired vision/perception;Decreased coordination;Pain;Impaired UE functional use      OT Treatment/Interventions: Self-care/ADL training;Therapeutic exercise;Energy conservation;DME and/or AE instruction;Therapeutic activities;Cognitive remediation/compensation;Visual/perceptual remediation/compensation;Patient/family education;Balance training;Neuromuscular education    OT Goals(Current goals can be found in the care plan section) Acute Rehab OT Goals Patient Stated Goal: none stated OT Goal Formulation: Patient unable to participate in goal setting Time For Goal Achievement: 06/25/18 Potential to Achieve Goals: Fair ADL Goals Pt Will Perform Grooming: with min assist;sitting Pt Will Transfer to Toilet: with mod assist;stand pivot transfer;bedside commode;squat pivot transfer Pt Will Perform Toileting - Clothing Manipulation and hygiene: with mod assist;sitting/lateral leans Additional ADL Goal #1: Pt will follow 3/5 one-step commands during seated grooming tasks. Additional ADL Goal #2: Pt will complete bed mobility with overall min assist in preparation for ADL participation seated at EOB.  OT Frequency: Min 2X/week   Barriers to D/C:            Co-evaluation PT/OT/SLP Co-Evaluation/Treatment: Yes Reason for Co-Treatment: Complexity of the patient's impairments (multi-system involvement) PT goals addressed during session: Mobility/safety with mobility OT goals addressed during session: ADL's and  self-care;Strengthening/ROM      AM-PAC PT "6 Clicks" Daily Activity     Outcome Measure Help from another  person eating meals?: Total Help from another person taking care of personal grooming?: A Lot Help from another person toileting, which includes using toliet, bedpan, or urinal?: Total Help from another person bathing (including washing, rinsing, drying)?: Total Help from another person to put on and taking off regular upper body clothing?: Total Help from another person to put on and taking off regular lower body clothing?: Total 6 Click Score: 7   End of Session Nurse Communication: Other (comment)(nurse tech - pt needs new Pure Whick)  Activity Tolerance: Patient limited by fatigue Patient left: in bed;with call bell/phone within reach;with bed alarm set  OT Visit Diagnosis: Muscle weakness (generalized) (M62.81);Hemiplegia and hemiparesis;Other symptoms and signs involving cognitive function Hemiplegia - Right/Left: Right Hemiplegia - caused by: Cerebral infarction                Time: 4388-8757 OT Time Calculation (min): 18 min Charges:  OT General Charges $OT Visit: 1 Visit OT Evaluation $OT Eval Moderate Complexity: [place] Pager [phone] Office [place] A Azoria Abbett 06/11/2018, 9:50 AM

## 2018-06-11 NOTE — Progress Notes (Signed)
Pt arrived on floor at approx 2240 via stretcher. Stacy Hendricks

## 2018-06-11 NOTE — Progress Notes (Signed)
*  Preliminary Results* Carotid artery duplex has been completed. Bilateral internal carotid arteries are 1-39%. Vertebral arteries are patent with antegrade flow.  06/11/2018 11:58 AM  Jinny Blossom Dawna Part

## 2018-06-11 NOTE — Progress Notes (Signed)
MRI/MRA of brain completed. I have reviewed the images and I agree with the radiology report. Radiologist impressions are as follows:  MRI HEAD: 1. Moderately motion degraded examination. Acute multifocal LEFT ACA territory nonhemorrhagic infarcts. 2. Old LEFT MCA/posterior border zone territory infarcts. Old RIGHT temporal/MCA territory infarct. Old small LEFT cerebellar infarct. 3. Moderate chronic small vessel ischemic changes. 4. Moderate to severe parenchymal brain volume loss.  MRA HEAD: 1. Emergent LEFT A2 occlusion with reconstitution. 2. LEFT M1 occlusion with reconstitution, likely chronic. 3. Occluded basilar tip, tenuous PCA filling from posterior communicating arteries. 4. Slow flow versus occluded LEFT vertebral artery. 5. Severe intracranial atherosclerosis.  A/R: 84 year old female with acute left ACA versus ACA/MCA watershed stroke. 1. Continue ASA.  2. Occluded basilar tip seen on MRA is of uncertain age, but most likely chronic given lack of elevated DWI signal at that location and presence of PCA filling via PCOMs bilaterally. Slow flow versus occluded left vertebral artery also noted. The above are seen in the context of severe intracranial atherosclerosis, therefore would add Plavix to ASA for DAPT regimen.  3. Not a VIR candidate for the 2 occlusions above given likely chronicity and also not a candidate for VIR regarding the left ACA occlusion due to unknown time of onset and mRS of 4-5. 4. Continue earlier recommendations per Dr. Joaquin Music' initial consult note, including permissive HTN protocol.   Electronically signed: Dr. Kerney Elbe

## 2018-06-11 NOTE — Progress Notes (Signed)
PROGRESS NOTE    Stacy Hendricks  DVV:[id number] DOB: 1929-08-26 DOA: 06/10/2018 PCP: Crist Infante, MD  Outpatient Specialists:   Brief Narrative:  Patient is an 84 year old woman with medical problems including coronary artery disease status post stent placement over 5 years ago, chronic kidney disease with baseline creatinine between 1.5 and 2, insulin-dependent diabetes, CML on TKI therapy, hypertension, bilateral eye blindness.  Patient is admitted with right-sided weakness and poor p.o. intake.  MRI of the brain revealed multifocal left ACA territory infarct.  Potassium was 3.2.  EKG done revealed prolonged QTc interval (561 ms).  Patient was on Nelotinib (Tasigna) prior to admission, but currently on hold.  We will continue to hold Tasigna until hypokalemia prolonged QTc interval resolved.  Will monitor EKG and electrolytes.  Will have a low threshold to consult oncology team.  Work-up is in progress.  We will also have a low threshold to consult palliative care team.  Assessment & Plan:   Active Problems:   CVA (cerebral vascular accident) ([place])   Acute CVA: Course: sought medical attention from SNF, found to have right sided weakness and right sided facial droop; CT head without hemorrhage; although no known hx of CVA, brain imaging revealed evidence of prior ischemic changes; neurology team consulted - recommended admission and stroke work up as well as evaluation for other acute medical problems A/P:  Will provide ASA, NPO until safe swallow status ensured (or alternative route of administration), PT/OT, MRI of brain, carotid dopplers, TTE, and telemetry to further eval etiology and evaluate, permissive HTN, risk stratification - A1c + lipid profile  06/11/2018: MRI brain revealed multifocal left ACA territory infarct.  Neurology input is appreciated.  Continue aspirin 81 mg p.o. once daily and Plavix.  As per neurology team, patient will continue dual antiplatelet therapy for 3  months, then change to aspirin alone.  Acute versus acute on chronic encephalopathy, probably metabolic: May be related to volume status, stroke, underlying cognitive disorder, depression Follow results of B12. TSH is low (0.254). Will restart home Synthroid, but we decreased the dose from 50 MCG p.o. once daily to 37.5 MCG p.o. once daily.   Continue IV fluids.  Continue to assess and monitor volume status. Culture thick yellowish drainage from the left eye. 06/11/2018: Continue to assess and manage.  Diabetes mellitus: Last A1c in 04/2018 of 6.7 Continue long-acting insulin and sliding scale coverage (subcutaneous Lantus insulin 10 units nightly and SSI coverage ACHS)  Hypokalemia: K-Dur 40 M EQ p.o. x1 dose. Monitor closely while on IV fluids. Optimize potassium level is QTc is prolonged We will also need to be careful, considering advanced chronic kidney disease (CKD stage IV) Check magnesium level. EKG to monitor QTC interval.    Prolonged QTc interval: EKG done on presentation revealed QTc interval of 561 ms Correct low potassium Follow magnesium level Continue to hold nilotinib for now. EKG daily. Low threshold to consult oncology team.   HTN and hx of CAD: was on BB, CCB, statin, plavix PTA, holding currently given NPO status  CKD stage IV:  Stable.  Patient is at baseline.   Continue to monitor.  Anemia: Likely multifactorial. Continue to monitor.   CML:  Followed by onc outpatient, on nilotinib 200 mg po q 12, hold until able to take PO, did have detectable CML transcript at last value Low threshold to consult oncology.  Blindness / glaucoma:  Continue eye gtts.   Follow eyes for culture.  Depression / adjustment disorder:  SSRI therapy, re-start  when able Patient may be failing to thrive. Low threshold to consult palliative care team.  Decreased PO intake:  Continue to monitor.   Low threshold to consult dietitian.   May improve as encephalopathy  and volume depletion improve.   Be component of failure to thrive.   Low threshold to consult palliative care team as documented above.   Patient was on Remeron prior to admission.     DVT prophylaxis: Subq Hep Code Status: DNR / do not intubate Disposition Plan: Anticipate D/C to SNF  Consults called: neurology team.  But have low threshold to consult oncology, palliative care and dietary team.  Procedures:   Echo revealed EF of 55 to 60%.  Grade 1 diastolic dysfunction reported.  Antimicrobials:   None   Subjective: No new complaints. Right-sided weakness persists. Left eye discharge noted  Objective: Vitals:   06/11/18 0629 06/11/18 0747 06/11/18 0830 06/11/18 1218  BP: (!) 127/93  (!) 159/66 (!) 146/67  Pulse: 62  (!) 57 64  Resp: 17  13 17   Temp:  98.7 F (37.1 C) 98.2 F (36.8 C) 98.4 F (36.9 C)  TempSrc:  Axillary Axillary Axillary  SpO2: 99%  100% 98%  Weight:      Height:        Intake/Output Summary (Last 24 hours) at 06/11/2018 1520 Last data filed at 06/11/2018 0500 Gross per 24 hour  Intake 1450.71 ml  Output -  Net 1450.71 ml   Filed Weights   06/10/18 1803 06/10/18 2242  Weight: 45.1 kg 52.8 kg    Examination:  General exam: Appears calm and comfortable.  Yellowish discharge from the left eye.   Respiratory system: Clear to auscultation.  Cardiovascular system: S1 & S2, with systolic murmur.  No pedal edema. Gastrointestinal system: Abdomen is nondistended, soft and nontender. No organomegaly or masses felt. Normal bowel sounds heard. Central nervous system: Right-sided hemiparesis.   Extremities: No leg edema.    Data Reviewed: I have personally reviewed following labs and imaging studies  CBC: Recent Labs  Lab 06/10/18 1850 06/10/18 1854 06/11/18 0506  WBC 5.5  --  4.2  NEUTROABS 3.9  --   --   HGB 10.0* 10.2* 9.1*  HCT 31.6* 30.0* 28.0*  MCV 91.6  --  88.9  PLT 141*  --  626*   Basic Metabolic Panel: Recent Labs  Lab  06/10/18 1850 06/10/18 1854 06/11/18 0506  NA 139 140 141  K 3.7 3.7 3.2*  CL 110 107 109  CO2 22  --  24  GLUCOSE 243* 234* 151*  BUN 15 17 12   CREATININE 2.03* 2.20* 1.73*  CALCIUM 8.2*  --  8.1*   GFR: Estimated Creatinine Clearance: 19.1 mL/min (A) (by C-G formula based on SCr of 1.73 mg/dL (H)). Liver Function Tests: Recent Labs  Lab 06/10/18 1850 06/11/18 0506  AST 23 21  ALT 22 20  ALKPHOS 156* 140*  BILITOT 1.1 0.9  PROT 6.3* 5.6*  ALBUMIN 3.0* 2.7*   No results for input(s): LIPASE, AMYLASE in the last 168 hours. Recent Labs  Lab 06/11/18 0506  AMMONIA 17   Coagulation Profile: Recent Labs  Lab 06/10/18 1850  INR 1.07   Cardiac Enzymes: No results for input(s): CKTOTAL, CKMB, CKMBINDEX, TROPONINI in the last 168 hours. BNP (last 3 results) No results for input(s): PROBNP in the last 8760 hours. HbA1C: No results for input(s): HGBA1C in the last 72 hours. CBG: Recent Labs  Lab 06/10/18 1943 06/10/18 2126 06/11/18 0331  06/11/18 0914 06/11/18 1216  GLUCAP 195* 180* 146* 113* 170*   Lipid Profile: Recent Labs    06/11/18 0506  CHOL 108  HDL 37*  LDLCALC 43  TRIG 139  CHOLHDL 2.9   Thyroid Function Tests: Recent Labs    06/11/18 0506  TSH 0.254*   Anemia Panel: Recent Labs    06/11/18 0506  VITAMINB12 576  FERRITIN 115  TIBC 221*  IRON 53  RETICCTPCT 1.9   Urine analysis:    Component Value Date/Time   COLORURINE AMBER (A) 06/10/2018 1923   APPEARANCEUR HAZY (A) 06/10/2018 1923   LABSPEC 1.018 06/10/2018 1923   PHURINE 5.0 06/10/2018 1923   GLUCOSEU 150 (A) 06/10/2018 [place] NEGATIVE 06/10/2018 [place] NEGATIVE 06/10/2018 1923   KETONESUR 5 (A) 06/10/2018 1923   PROTEINUR 30 (A) 06/10/2018 1923   NITRITE NEGATIVE 06/10/2018 1923   LEUKOCYTESUR NEGATIVE 06/10/2018 1923   Sepsis Labs: @LABRCNTIP (procalcitonin:4,lacticidven:4)  ) Recent Results (from the past 240 hour(s))  MRSA PCR Screening      Status: None   Collection Time: 06/10/18 10:50 PM  Result Value Ref Range Status   MRSA by PCR NEGATIVE NEGATIVE Final    Comment:        The GeneXpert MRSA Assay (FDA approved for NASAL specimens only), is one component of a comprehensive MRSA colonization surveillance program. It is not intended to diagnose MRSA infection nor to guide or monitor treatment for MRSA infections. Performed at [facility], [place] [address], [place], [place] [address]   Aerobic Culture (superficial specimen)     Status: None (Preliminary result)   Collection Time: 06/11/18 11:25 AM  Result Value Ref Range Status   Specimen Description EYE  Final   Special Requests NONE  Final   Gram Stain   Final    RARE WBC PRESENT,BOTH PMN AND MONONUCLEAR FEW GRAM POSITIVE RODS RARE GRAM POSITIVE COCCI IN PAIRS Performed at [facility], [address], [place], [place] [address]    Culture PENDING  Incomplete   Report Status PENDING  Incomplete         Radiology Studies: Mr Brain Wo Contrast  Result Date: 06/11/2018 CLINICAL DATA:  Worsening confusion, RIGHT-sided weakness. History of stroke, blindness, hypertension and hyperlipidemia. EXAM: MRI HEAD WITHOUT CONTRAST MRA HEAD WITHOUT CONTRAST TECHNIQUE: Multiplanar, multiecho pulse sequences of the brain and surrounding structures were obtained without intravenous contrast. Angiographic images of the head were obtained using MRA technique without contrast. COMPARISON:  CT HEAD June 10, 2018 FINDINGS: MRI HEAD FINDINGS-moderately motion degraded examination. INTRACRANIAL CONTENTS: Patchy reduced diffusion mesial LEFT frontal parietal lobes including splenium of the corpus callosum with low ADC values. Susceptibility artifact LEFT lateral ventricle versus thalamus. A few scattered chronic microhemorrhages. LEFT frontal parietoccipital lobe encephalomalacia with ex vacuo dilatation LEFT occipital horn. Moderate to severe parenchymal brain volume  loss. Old small LEFT cerebellar infarcts. Small area of RIGHT temporal lobe encephalomalacia. Prominent basal ganglia perivascular spaces associated chronic small vessel ischemic changes. Patchy supratentorial white matter FLAIR T2 hyperintensities. No midline shift, mass effect or masses. No abnormal extra-axial fluid collections. VASCULAR: Normal major intracranial vascular flow voids present at skull base. SKULL AND UPPER CERVICAL SPINE: No abnormal sellar expansion. No suspicious calvarial bone marrow signal. Craniocervical junction maintained. SINUSES/ORBITS: The mastoid air-cells and included paranasal sinuses are well-aerated.The included ocular globes and orbital contents are non-suspicious. OTHER: Patient is edentulous. MRA HEAD FINDINGS ANTERIOR CIRCULATION: Normal flow related enhancement of the included  cervical, petrous, cavernous and supraclinoid internal carotid arteries. Patent anterior communicating artery. LEFT A2 occlusion with reconstitution within 1 cm. Occluded LEFT M1 segments with reconstitution. Tandem severe stenosis anterior middle cerebral arteries. No aneurysm. POSTERIOR CIRCULATION: No LEFT vertebral artery flow related enhancement. Patent RIGHT vertebral artery and basilar artery. Severe stenosis bilateral SCA. Occluded distal basilar artery. Thready flow related enhancement LEFT posterior cerebral artery from LEFT PCOM. Patent RIGHT posterior cerebral artery via posterior communicating artery. Tandem severe stenosis posterior cerebral arteries. No aneurysm. ANATOMIC VARIANTS: None. Source images and MIP images were reviewed. IMPRESSION: MRI HEAD: 1. Moderately motion degraded examination. Acute multifocal LEFT ACA territory nonhemorrhagic infarcts. 2. Old LEFT MCA/posterior border zone territory infarcts. Old RIGHT temporal/MCA territory infarct. Old small LEFT cerebellar infarct. 3. Moderate chronic small vessel ischemic changes. 4. Moderate to severe parenchymal brain volume loss.  MRA HEAD: 1. Emergent LEFT A2 occlusion with reconstitution. 2. LEFT M1 occlusion with reconstitution, likely chronic. 3. Occluded basilar tip, tenuous PCA filling from posterior communicating arteries. 4. Slow flow versus occluded LEFT vertebral artery. 5. Severe intracranial atherosclerosis. These results will be called to the ordering clinician or representative by the Radiologist Assistant, and communication documented in the PACS or zVision Dashboard. Electronically Signed   By: Elon Alas M.D.   On: 06/11/2018 03:18   Dg Chest Portable 1 View  Result Date: 06/10/2018 CLINICAL DATA:  Cough. EXAM: PORTABLE CHEST 1 VIEW COMPARISON:  None. FINDINGS: Patient is rotated to the left. Lungs are adequately inflated without consolidation or effusion. Mild increased density over the midline neck base at the level of the sternal notch likely due to goiter. Mild cardiomegaly. Degenerative change of the spine. IMPRESSION: No acute cardiopulmonary disease. Mild cardiomegaly. Electronically Signed   By: Marin Olp M.D.   On: 06/10/2018 20:02   Mr Jodene Nam Head Wo Contrast  Result Date: 06/11/2018 CLINICAL DATA:  Worsening confusion, RIGHT-sided weakness. History of stroke, blindness, hypertension and hyperlipidemia. EXAM: MRI HEAD WITHOUT CONTRAST MRA HEAD WITHOUT CONTRAST TECHNIQUE: Multiplanar, multiecho pulse sequences of the brain and surrounding structures were obtained without intravenous contrast. Angiographic images of the head were obtained using MRA technique without contrast. COMPARISON:  CT HEAD June 10, 2018 FINDINGS: MRI HEAD FINDINGS-moderately motion degraded examination. INTRACRANIAL CONTENTS: Patchy reduced diffusion mesial LEFT frontal parietal lobes including splenium of the corpus callosum with low ADC values. Susceptibility artifact LEFT lateral ventricle versus thalamus. A few scattered chronic microhemorrhages. LEFT frontal parietoccipital lobe encephalomalacia with ex vacuo  dilatation LEFT occipital horn. Moderate to severe parenchymal brain volume loss. Old small LEFT cerebellar infarcts. Small area of RIGHT temporal lobe encephalomalacia. Prominent basal ganglia perivascular spaces associated chronic small vessel ischemic changes. Patchy supratentorial white matter FLAIR T2 hyperintensities. No midline shift, mass effect or masses. No abnormal extra-axial fluid collections. VASCULAR: Normal major intracranial vascular flow voids present at skull base. SKULL AND UPPER CERVICAL SPINE: No abnormal sellar expansion. No suspicious calvarial bone marrow signal. Craniocervical junction maintained. SINUSES/ORBITS: The mastoid air-cells and included paranasal sinuses are well-aerated.The included ocular globes and orbital contents are non-suspicious. OTHER: Patient is edentulous. MRA HEAD FINDINGS ANTERIOR CIRCULATION: Normal flow related enhancement of the included cervical, petrous, cavernous and supraclinoid internal carotid arteries. Patent anterior communicating artery. LEFT A2 occlusion with reconstitution within 1 cm. Occluded LEFT M1 segments with reconstitution. Tandem severe stenosis anterior middle cerebral arteries. No aneurysm. POSTERIOR CIRCULATION: No LEFT vertebral artery flow related enhancement. Patent RIGHT vertebral artery and basilar artery. Severe stenosis bilateral SCA. Occluded distal basilar  artery. Thready flow related enhancement LEFT posterior cerebral artery from LEFT PCOM. Patent RIGHT posterior cerebral artery via posterior communicating artery. Tandem severe stenosis posterior cerebral arteries. No aneurysm. ANATOMIC VARIANTS: None. Source images and MIP images were reviewed. IMPRESSION: MRI HEAD: 1. Moderately motion degraded examination. Acute multifocal LEFT ACA territory nonhemorrhagic infarcts. 2. Old LEFT MCA/posterior border zone territory infarcts. Old RIGHT temporal/MCA territory infarct. Old small LEFT cerebellar infarct. 3. Moderate chronic small  vessel ischemic changes. 4. Moderate to severe parenchymal brain volume loss. MRA HEAD: 1. Emergent LEFT A2 occlusion with reconstitution. 2. LEFT M1 occlusion with reconstitution, likely chronic. 3. Occluded basilar tip, tenuous PCA filling from posterior communicating arteries. 4. Slow flow versus occluded LEFT vertebral artery. 5. Severe intracranial atherosclerosis. These results will be called to the ordering clinician or representative by the Radiologist Assistant, and communication documented in the PACS or zVision Dashboard. Electronically Signed   By: Elon Alas M.D.   On: 06/11/2018 03:18   Ct Head Code Stroke Wo Contrast  Result Date: 06/10/2018 CLINICAL DATA:  Code stroke.  Generalized weakness and dysesthesias. EXAM: CT HEAD WITHOUT CONTRAST TECHNIQUE: Contiguous axial images were obtained from the base of the skull through the vertex without intravenous contrast. COMPARISON:  None. FINDINGS: Brain: The brainstem shows chronic small-vessel change of the pons. No focal cerebellar insult. Cerebral hemispheres show generalized atrophy. There chronic small-vessel ischemic changes of the white matter. No large vessel stroke seen in the right hemisphere. On the left, there is old cortical and subcortical infarction in the posterior frontal and the parietal region. No identifiable acute infarction. No mass lesion, hemorrhage, hydrocephalus or extra-axial collection. Vascular: There is atherosclerotic calcification of the major vessels at the base of the brain. Skull: Negative Sinuses/Orbits: Clear/normal Other: None ASPECTS: Unable to apply in the setting of old infarctions on the left and without lateralizing findings in the history. IMPRESSION: 1. No acute finding by CT. Atrophy. Old cortical and subcortical infarctions in the left frontal and left parietal regions. Chronic small-vessel ischemic changes elsewhere. 2. ASPECTS is not applicable given the absence of lateralizing signs and the  extensive chronic changes. 3. These results were communicated to Dr. Rory Percy At 7:13 pmon 9/15/2019by text page via the [facility] messaging system. Electronically Signed   By: Nelson Chimes M.D.   On: 06/10/2018 19:14        Scheduled Meds: . aspirin  300 mg Rectal Daily   Or  . aspirin  325 mg Oral Daily  . brimonidine  1 drop Both Eyes BID   And  . timolol  1 drop Both Eyes BID  . chlorhexidine  15 mL Mouth Rinse BID  . clopidogrel  75 mg Oral Daily  . heparin  5,000 Units Subcutaneous Q8H  . insulin aspart  0-9 Units Subcutaneous TID WC  . insulin glargine  10 Units Subcutaneous QHS  . latanoprost  1 drop Both Eyes QHS  . mouth rinse  15 mL Mouth Rinse q12n4p   Continuous Infusions: . sodium chloride 100 mL/hr at 06/11/18 1227     LOS: 1 day    Time spent: 40 minutes.    Dana Allan, MD  [facility] Pager #: [phone] 7PM-7AM contact night coverage as above

## 2018-06-11 NOTE — Evaluation (Signed)
Physical Therapy Evaluation Patient Details Name: Stacy Hendricks MRN: [id number] DOB: Sep 15, 1929 Today's Date: 06/11/2018   History of Present Illness  84 year old woman with medical problems including CAD status post stent placement CKD, insulin-dependent DM, CML on TKI therapy, hypertension, bilateral eye blindness who presents with right sided weakness. No presents with acute left ACA versus ACA/MCA watershed stroke  Clinical Impression  Orders received for PT evaluation. Patient demonstrates deficits in functional mobility as indicated below. Will benefit from continued skilled PT to address deficits and maximize function. Will see as indicated and progress as tolerated.  Prior to admission, patient resident at [facility], unsure of mobility baseline. At this time, patient requires significant physical assist for all aspects of functional mobility. Patient will need post acute rehabilitation, recommend SNF.    Follow Up Recommendations SNF    Equipment Recommendations  None recommended by PT    Recommendations for Other Services       Precautions / Restrictions Precautions Precautions: Fall      Mobility  Bed Mobility Overal bed mobility: Needs Assistance Bed Mobility: Rolling;Supine to Sit;Sit to Supine Rolling: Mod assist   Supine to sit: Max assist Sit to supine: Max assist;+2 for safety/equipment   General bed mobility comments: Moderate assist to roll to the right, patient able to utiize LUE to reach and grab for rails. Max assist to elevate trunk to upright at EOB and Max assist for controlled descent back to supine with +2 for repositioning  Transfers Overall transfer level: Needs assistance Equipment used: 2 person hand held assist Transfers: Sit to/from Stand Sit to Stand: Max assist;+2 physical assistance         General transfer comment: Max assist to power up (2 persons) face to face with chuck pad. Unable to reach full upright postiion. RLE blocked  out by therapist during transition.  Ambulation/Gait             General Gait Details: unable to perform  Stairs            Wheelchair Mobility    Modified Rankin (Stroke Patients Only) Modified Rankin (Stroke Patients Only) Pre-Morbid Rankin Score: Moderately severe disability Modified Rankin: Severe disability     Balance Overall balance assessment: Needs assistance Sitting-balance support: Feet supported Sitting balance-Leahy Scale: Poor Sitting balance - Comments: assist for therapist to maintain sitting balance at EOB. Tolerate ~6 minutes at EOB Postural control: Right lateral lean   Standing balance-Leahy Scale: Zero                               Pertinent Vitals/Pain Pain Assessment: Faces Faces Pain Scale: Hurts little more Pain Location: head and generalized Pain Descriptors / Indicators: Grimacing;Moaning Pain Intervention(s): Monitored during session;Repositioned    Home Living Family/patient expects to be discharged to:: Skilled nursing facility                      Prior Function Level of Independence: Needs assistance         Comments: patient unable to provide details     Hand Dominance        Extremity/Trunk Assessment   Upper Extremity Assessment Upper Extremity Assessment: Generalized weakness;RUE deficits/detail RUE Deficits / Details: Some apraxia RUE, able to utilize and move during functional task performance but unable to activate upon command RUE Coordination: decreased fine motor;decreased gross motor    Lower Extremity Assessment Lower Extremity Assessment: Generalized weakness;RLE deficits/detail  RLE Deficits / Details: NO activation of RLE even with functional task performance. Noted flaccidity RLE Coordination: decreased fine motor;decreased gross motor    Cervical / Trunk Assessment Cervical / Trunk Assessment: Kyphotic  Communication   Communication: Expressive difficulties  Cognition  Arousal/Alertness: Awake/alert Behavior During Therapy: Flat affect Overall Cognitive Status: Impaired/Different from baseline Area of Impairment: Orientation;Attention;Memory;Following commands;Safety/judgement;Awareness;Problem solving                 Orientation Level: Disoriented to;Place;Time;Situation Current Attention Level: Sustained   Following Commands: Follows one step commands inconsistently;Follows one step commands with increased time   Awareness: Intellectual Problem Solving: Slow processing;Decreased initiation;Difficulty sequencing;Requires verbal cues;Requires tactile cues        General Comments General comments (skin integrity, edema, etc.): blood pressure 159/66 heart rate 62    Exercises     Assessment/Plan    PT Assessment Patient needs continued PT services  PT Problem List Decreased strength;Decreased activity tolerance;Decreased balance;Decreased mobility;Decreased coordination;Decreased cognition;Decreased safety awareness;Impaired sensation;Impaired tone       PT Treatment Interventions DME instruction;Gait training;Functional mobility training;Therapeutic activities;Therapeutic exercise;Balance training;Neuromuscular re-education;Cognitive remediation;Patient/family education;Wheelchair mobility training;Manual techniques;Modalities    PT Goals (Current goals can be found in the Care Plan section)  Acute Rehab PT Goals Patient Stated Goal: none stated PT Goal Formulation: Patient unable to participate in goal setting Time For Goal Achievement: 06/25/18 Potential to Achieve Goals: Fair    Frequency Min 2X/week   Barriers to discharge        Co-evaluation PT/OT/SLP Co-Evaluation/Treatment: Yes Reason for Co-Treatment: Complexity of the patient's impairments (multi-system involvement) PT goals addressed during session: Mobility/safety with mobility OT goals addressed during session: ADL's and self-care       AM-PAC PT "6 Clicks"  Daily Activity  Outcome Measure Difficulty turning over in bed (including adjusting bedclothes, sheets and blankets)?: Unable Difficulty moving from lying on back to sitting on the side of the bed? : Unable Difficulty sitting down on and standing up from a chair with arms (e.g., wheelchair, bedside commode, etc,.)?: Unable Help needed moving to and from a bed to chair (including a wheelchair)?: Total Help needed walking in hospital room?: Total Help needed climbing 3-5 steps with a railing? : Total 6 Click Score: 6    End of Session   Activity Tolerance: Patient limited by fatigue Patient left: in bed;with call bell/phone within reach;with bed alarm set;with SCD's reapplied Nurse Communication: Mobility status PT Visit Diagnosis: Muscle weakness (generalized) (M62.81);Apraxia (R48.2);Other symptoms and signs involving the nervous system (R29.898)    Time: 1610-9604 PT Time Calculation (min) (ACUTE ONLY): 18 min   Charges:   PT Evaluation $PT Eval Moderate Complexity: 1 Mod          Alben Deeds, PT DPT  Board Certified Neurologic Specialist Acute Rehabilitation Services Pager [phone] Office [phone]   Duncan Dull 06/11/2018, 8:59 AM

## 2018-06-11 NOTE — Evaluation (Signed)
Clinical/Bedside Swallow Evaluation Patient Details  Name: Stacy Hendricks MRN: [id number] Date of Birth: 1929-02-01  Today's Date: 06/11/2018 Time: SLP Start Time (ACUTE ONLY): 1010 SLP Stop Time (ACUTE ONLY): 1030 SLP Time Calculation (min) (ACUTE ONLY): 20 min  Past Medical History:  Past Medical History:  Diagnosis Date  . Anemia   . Chronic kidney disease   . CML (chronic myeloid leukemia) ([place])   . Depression   . Diabetes mellitus without complication ([place])   . Hyperlipidemia   . Hypertension   . Thyroid disease    Past Surgical History:  Past Surgical History:  Procedure Laterality Date  . APPENDECTOMY    . PTCA     HPI:  84 year old female admitted 06/10/18 with difficulty moving her right side, decline in po intake. PMH: CAD, CKD, IDDM, CML, HTN, bilateral blindness. MRI = acute multifocal L ACA infarcts, old L MCA, R temporal, L cerebellar infarcts.   Assessment / Plan / Recommendation Clinical Impression  Pt seen for assessment of swallow function and safety, and to identify least restrictive diet. Pt oral motor strength and function appear adequate. Upper and lower dentures were placed for po trials. Pt tolerated trials of thin liquid, puree, and solid without oral difficulty or delay, and without overt s/s aspiration. Will begin regular diet, thin liquids and follow for diet tolerance.    SLP Visit Diagnosis: Dysphagia, unspecified (R13.10)    Aspiration Risk  Moderate aspiration risk    Diet Recommendation Regular;Thin liquid   Liquid Administration via: Straw;Cup Medication Administration: Whole meds with liquid Supervision: Staff to assist with self feeding;Full supervision/cueing for compensatory strategies Compensations: Minimize environmental distractions;Slow rate;Small sips/bites Postural Changes: Seated upright at 90 degrees;Remain upright for at least 30 minutes after po intake    Other  Recommendations Oral Care Recommendations: Oral care BID    Follow up Recommendations 24 hour supervision/assistance      Frequency and Duration min 1 x/week  1 week;2 weeks       Prognosis Prognosis for Safe Diet Advancement: Good      Swallow Study   General Date of Onset: 06/10/18 HPI: 84 year old female admitted 06/10/18 with difficulty moving her right side, decline in po intake. PMH: CAD, CKD, IDDM, CML, HTN, bilateral blindness. MRI = acute multifocal L ACA infarcts, old L MCA, R temporal, L cerebellar infarcts. Type of Study: Bedside Swallow Evaluation Previous Swallow Assessment: none Diet Prior to this Study: NPO Temperature Spikes Noted: No Respiratory Status: Room air History of Recent Intubation: No Behavior/Cognition: Alert;Cooperative Oral Cavity Assessment: Dry Oral Care Completed by SLP: Yes Oral Cavity - Dentition: Dentures, top;Dentures, bottom Vision: (pt is blind) Self-Feeding Abilities: Able to feed self Patient Positioning: Upright in bed Baseline Vocal Quality: Normal Volitional Cough: Strong Volitional Swallow: Able to elicit    Oral/Motor/Sensory Function Overall Oral Motor/Sensory Function: Within functional limits   Ice Chips Ice chips: Within functional limits Presentation: Spoon   Thin Liquid Thin Liquid: Within functional limits Presentation: Straw    Nectar Thick Nectar Thick Liquid: Not tested   Honey Thick Honey Thick Liquid: Not tested   Puree Puree: Within functional limits Presentation: Spoon   Solid     Solid: Within functional limits Presentation: [facility] B. Qu[facility], [facility], [place] Speech Language Pathologist [phone]  Shonna Chock 06/11/2018,10:53 AM

## 2018-06-11 NOTE — Progress Notes (Addendum)
STROKE TEAM PROGRESS NOTE  HPI:( Dr Rory Percy ) Stacy Hendricks is a 84 y.o. female past medical history of anemia, chronic myeloid leukemia, depression, diabetes, hypertension, hyperlipidemia, cognitive decline brought in from [place] facility with reporting of right arm weakness right leg weakness and right facial droop initially last seen normal 06/08/2018 but later on calling the facility said she was last seen normal at 8 AM and at about 4:30 PM is when she started complaining of weakness of the right side.  They could not corroborate a definitive last known normal. The patient lives in the facility with her husband who is severely demented.  The patient herself is blind at baseline and has some cognitive decline but less than her husband-this is according to the family was at bedside. She has required 24/7 assistance now for at least a month or 2 and has had episodes where she would not speak with the family making them think that she has been depressed lately. The family last saw her 1 week ago.  They could not tell me if at that time she had any weakness tingling numbness. She reports generalized weakness herself.  She is very slow to respond to questions.  There is some right-sided hemiparesis.  And right lower facial weakness suggestive of a stroke  LKW: To the best of history taking-8 AM on 06/10/2018 tpa given?: no, outside the window Premorbid modified Rankin scale (mRS): 4-5-requires 24/7 assistance INTERVAL HISTORY Her daughter-in-law is at the bedside.  Pt lying in bed. Son expected to arrive soon.I spoke to the son over the phone this afternoon as well  Vitals:   06/11/18 0340 06/11/18 0429 06/11/18 0629 06/11/18 0747  BP: (!) 163/50  (!) 127/93   Pulse: (!) 56 (!) 56 62   Resp: 18 17 17    Temp: 97.7 F (36.5 C)   98.7 F (37.1 C)  TempSrc: Oral   Axillary  SpO2: 100%  99%   Weight:      Height:        CBC:  Recent Labs  Lab 06/10/18 1850 06/10/18 1854  06/11/18 0506  WBC 5.5  --  4.2  NEUTROABS 3.9  --   --   HGB 10.0* 10.2* 9.1*  HCT 31.6* 30.0* 28.0*  MCV 91.6  --  88.9  PLT 141*  --  144*    Basic Metabolic Panel:  Recent Labs  Lab 06/10/18 1850 06/10/18 1854 06/11/18 0506  NA 139 140 141  K 3.7 3.7 3.2*  CL 110 107 109  CO2 22  --  24  GLUCOSE 243* 234* 151*  BUN 15 17 12   CREATININE 2.03* 2.20* 1.73*  CALCIUM 8.2*  --  8.1*   Lipid Panel:     Component Value Date/Time   CHOL 108 06/11/2018 0506   TRIG 139 06/11/2018 0506   HDL 37 (L) 06/11/2018 0506   CHOLHDL 2.9 06/11/2018 0506   VLDL 28 06/11/2018 0506   LDLCALC 43 06/11/2018 0506   HgbA1c:  Lab Results  Component Value Date   HGBA1C 6.7 05/02/2018   Urine Drug Screen: No results found for: LABOPIA, COCAINSCRNUR, LABBENZ, AMPHETMU, THCU, LABBARB  Alcohol Level No results found for: [facility]  IMAGING Mr Brain Wo Contrast  Result Date: 06/11/2018 CLINICAL DATA:  Worsening confusion, RIGHT-sided weakness. History of stroke, blindness, hypertension and hyperlipidemia. EXAM: MRI HEAD WITHOUT CONTRAST MRA HEAD WITHOUT CONTRAST TECHNIQUE: Multiplanar, multiecho pulse sequences of the brain and surrounding structures were obtained without intravenous contrast. Angiographic images  of the head were obtained using MRA technique without contrast. COMPARISON:  CT HEAD June 10, 2018 FINDINGS: MRI HEAD FINDINGS-moderately motion degraded examination. INTRACRANIAL CONTENTS: Patchy reduced diffusion mesial LEFT frontal parietal lobes including splenium of the corpus callosum with low ADC values. Susceptibility artifact LEFT lateral ventricle versus thalamus. A few scattered chronic microhemorrhages. LEFT frontal parietoccipital lobe encephalomalacia with ex vacuo dilatation LEFT occipital horn. Moderate to severe parenchymal brain volume loss. Old small LEFT cerebellar infarcts. Small area of RIGHT temporal lobe encephalomalacia. Prominent basal ganglia perivascular spaces  associated chronic small vessel ischemic changes. Patchy supratentorial white matter FLAIR T2 hyperintensities. No midline shift, mass effect or masses. No abnormal extra-axial fluid collections. VASCULAR: Normal major intracranial vascular flow voids present at skull base. SKULL AND UPPER CERVICAL SPINE: No abnormal sellar expansion. No suspicious calvarial bone marrow signal. Craniocervical junction maintained. SINUSES/ORBITS: The mastoid air-cells and included paranasal sinuses are well-aerated.The included ocular globes and orbital contents are non-suspicious. OTHER: Patient is edentulous. MRA HEAD FINDINGS ANTERIOR CIRCULATION: Normal flow related enhancement of the included cervical, petrous, cavernous and supraclinoid internal carotid arteries. Patent anterior communicating artery. LEFT A2 occlusion with reconstitution within 1 cm. Occluded LEFT M1 segments with reconstitution. Tandem severe stenosis anterior middle cerebral arteries. No aneurysm. POSTERIOR CIRCULATION: No LEFT vertebral artery flow related enhancement. Patent RIGHT vertebral artery and basilar artery. Severe stenosis bilateral SCA. Occluded distal basilar artery. Thready flow related enhancement LEFT posterior cerebral artery from LEFT PCOM. Patent RIGHT posterior cerebral artery via posterior communicating artery. Tandem severe stenosis posterior cerebral arteries. No aneurysm. ANATOMIC VARIANTS: None. Source images and MIP images were reviewed. IMPRESSION: MRI HEAD: 1. Moderately motion degraded examination. Acute multifocal LEFT ACA territory nonhemorrhagic infarcts. 2. Old LEFT MCA/posterior border zone territory infarcts. Old RIGHT temporal/MCA territory infarct. Old small LEFT cerebellar infarct. 3. Moderate chronic small vessel ischemic changes. 4. Moderate to severe parenchymal brain volume loss. MRA HEAD: 1. Emergent LEFT A2 occlusion with reconstitution. 2. LEFT M1 occlusion with reconstitution, likely chronic. 3. Occluded basilar  tip, tenuous PCA filling from posterior communicating arteries. 4. Slow flow versus occluded LEFT vertebral artery. 5. Severe intracranial atherosclerosis. These results will be called to the ordering clinician or representative by the Radiologist Assistant, and communication documented in the PACS or zVision Dashboard. Electronically Signed   By: Elon Alas M.D.   On: 06/11/2018 03:18   Dg Chest Portable 1 View  Result Date: 06/10/2018 CLINICAL DATA:  Cough. EXAM: PORTABLE CHEST 1 VIEW COMPARISON:  None. FINDINGS: Patient is rotated to the left. Lungs are adequately inflated without consolidation or effusion. Mild increased density over the midline neck base at the level of the sternal notch likely due to goiter. Mild cardiomegaly. Degenerative change of the spine. IMPRESSION: No acute cardiopulmonary disease. Mild cardiomegaly. Electronically Signed   By: Marin Olp M.D.   On: 06/10/2018 20:02   Mr Jodene Nam Head Wo Contrast  Result Date: 06/11/2018 CLINICAL DATA:  Worsening confusion, RIGHT-sided weakness. History of stroke, blindness, hypertension and hyperlipidemia. EXAM: MRI HEAD WITHOUT CONTRAST MRA HEAD WITHOUT CONTRAST TECHNIQUE: Multiplanar, multiecho pulse sequences of the brain and surrounding structures were obtained without intravenous contrast. Angiographic images of the head were obtained using MRA technique without contrast. COMPARISON:  CT HEAD June 10, 2018 FINDINGS: MRI HEAD FINDINGS-moderately motion degraded examination. INTRACRANIAL CONTENTS: Patchy reduced diffusion mesial LEFT frontal parietal lobes including splenium of the corpus callosum with low ADC values. Susceptibility artifact LEFT lateral ventricle versus thalamus. A few scattered chronic microhemorrhages. LEFT frontal  parietoccipital lobe encephalomalacia with ex vacuo dilatation LEFT occipital horn. Moderate to severe parenchymal brain volume loss. Old small LEFT cerebellar infarcts. Small area of RIGHT temporal  lobe encephalomalacia. Prominent basal ganglia perivascular spaces associated chronic small vessel ischemic changes. Patchy supratentorial white matter FLAIR T2 hyperintensities. No midline shift, mass effect or masses. No abnormal extra-axial fluid collections. VASCULAR: Normal major intracranial vascular flow voids present at skull base. SKULL AND UPPER CERVICAL SPINE: No abnormal sellar expansion. No suspicious calvarial bone marrow signal. Craniocervical junction maintained. SINUSES/ORBITS: The mastoid air-cells and included paranasal sinuses are well-aerated.The included ocular globes and orbital contents are non-suspicious. OTHER: Patient is edentulous. MRA HEAD FINDINGS ANTERIOR CIRCULATION: Normal flow related enhancement of the included cervical, petrous, cavernous and supraclinoid internal carotid arteries. Patent anterior communicating artery. LEFT A2 occlusion with reconstitution within 1 cm. Occluded LEFT M1 segments with reconstitution. Tandem severe stenosis anterior middle cerebral arteries. No aneurysm. POSTERIOR CIRCULATION: No LEFT vertebral artery flow related enhancement. Patent RIGHT vertebral artery and basilar artery. Severe stenosis bilateral SCA. Occluded distal basilar artery. Thready flow related enhancement LEFT posterior cerebral artery from LEFT PCOM. Patent RIGHT posterior cerebral artery via posterior communicating artery. Tandem severe stenosis posterior cerebral arteries. No aneurysm. ANATOMIC VARIANTS: None. Source images and MIP images were reviewed. IMPRESSION: MRI HEAD: 1. Moderately motion degraded examination. Acute multifocal LEFT ACA territory nonhemorrhagic infarcts. 2. Old LEFT MCA/posterior border zone territory infarcts. Old RIGHT temporal/MCA territory infarct. Old small LEFT cerebellar infarct. 3. Moderate chronic small vessel ischemic changes. 4. Moderate to severe parenchymal brain volume loss. MRA HEAD: 1. Emergent LEFT A2 occlusion with reconstitution. 2. LEFT M1  occlusion with reconstitution, likely chronic. 3. Occluded basilar tip, tenuous PCA filling from posterior communicating arteries. 4. Slow flow versus occluded LEFT vertebral artery. 5. Severe intracranial atherosclerosis. These results will be called to the ordering clinician or representative by the Radiologist Assistant, and communication documented in the PACS or zVision Dashboard. Electronically Signed   By: Elon Alas M.D.   On: 06/11/2018 03:18   Ct Head Code Stroke Wo Contrast  Result Date: 06/10/2018 CLINICAL DATA:  Code stroke.  Generalized weakness and dysesthesias. EXAM: CT HEAD WITHOUT CONTRAST TECHNIQUE: Contiguous axial images were obtained from the base of the skull through the vertex without intravenous contrast. COMPARISON:  None. FINDINGS: Brain: The brainstem shows chronic small-vessel change of the pons. No focal cerebellar insult. Cerebral hemispheres show generalized atrophy. There chronic small-vessel ischemic changes of the white matter. No large vessel stroke seen in the right hemisphere. On the left, there is old cortical and subcortical infarction in the posterior frontal and the parietal region. No identifiable acute infarction. No mass lesion, hemorrhage, hydrocephalus or extra-axial collection. Vascular: There is atherosclerotic calcification of the major vessels at the base of the brain. Skull: Negative Sinuses/Orbits: Clear/normal Other: None ASPECTS: Unable to apply in the setting of old infarctions on the left and without lateralizing findings in the history. IMPRESSION: 1. No acute finding by CT. Atrophy. Old cortical and subcortical infarctions in the left frontal and left parietal regions. Chronic small-vessel ischemic changes elsewhere. 2. ASPECTS is not applicable given the absence of lateralizing signs and the extensive chronic changes. 3. These results were communicated to Dr. Rory Percy At 7:13 pmon 9/15/2019by text page via the [facility] messaging system. Electronically  Signed   By: Nelson Chimes M.D.   On: 06/10/2018 19:14    PHYSICAL EXAM Frail elderly Caucasian lady lying comfortably in bed. Not in distress. . Afebrile. Head is  nontraumatic. Neck is supple without bruit.    Cardiac exam no murmur or gallop. Lungs are clear to auscultation. Distal pulses are well felt. Neurological Exam ;  Awake alert very slow to respond with nonfluent [email or web address] dysarthria but can be understood. Increase reaction time. Can follow only simple midline and one-step commands. Diminished attention, registration and recall. She is blind at baseline but she can follow gaze in all directions. She does not blink to threat bilaterally. Right lower facial weakness. Tongue midline. Motor system exam shows dense right hemiplegia but able to withdraw against gravity in both right upper and lower extremity. Normal antigravity strength in the left side. Diminished sensation on the right side.gait deferred ASSESSMENT/PLAN Stacy Hendricks is a 84 y.o. female with history of anemia, chronic myeloid leukemia, depression, diabetes, hypertension, hyperlipidemia, cognitive decline presenting with R sided numbness and weakness.   Stroke:  left ACA infarct secondary diffuse intracranial atherosclerosis   Code Stroke CT head No acute stroke. Old cortical and subcortical infarcts L frontal and L parietal. Small vessel disease. Atrophy.   MRI  L ACA infarct. Old L MCA, R temporal/MCA, L cerebella infarcts. Small vessel disease. Atrophy. Severe IC atherosclerosis   MRA  L A2 ELVO. L M1 occlusion (chronic). Occluded basilar tp. tenusous PCA filling. L VA slow flos vs occlusion. Severe IC atherosclerosis   Carotid Doppler  B ICA 1-39% stenosis, VAs antegrade   2D Echo  EF 55-60%. No source of embolus   LDL 43  HgbA1c 6.7  Heparin 5000 units sq tid for VTE prophylaxis  clopidogrel 75 mg daily prior to admission, now on aspirin 325 mg daily and clopidogrel 75 mg daily. Decrease aspirin to 81 mg  and continue DAPT x 3 months then aspirin alone  Therapy recommendations:  SNF  Disposition:  pending  (from [place])  Hypertension  Stable . BP goal normotensive  Hyperlipidemia  Home meds:  crestor 10  crestor not yet resumed in hospital  LDL 43, goal < 70  Continue statin at discharge or document contraindication/reason why not continued  Diabetes type II  HgbA1c 6.7, goal < 7.0  Other Stroke Risk Factors  Advanced age  Coronary artery disease  Other Active Problems  Baseline memory decline  CKD  Anemia  Blindness/glaucoma  Depression/adjustment disorder  [facility]  Hospital day # 1  Burnetta Sabin, MSN, APRN, ANVP-BC, AGPCNP-BC Advanced Practice Stroke Nurse [place] for Schedule & Pager information 06/11/2018 12:56 PM  I have personally examined this patient, reviewed notes, independently viewed imaging studies, participated in medical decision making and plan of care.ROS completed by me personally and pertinent positives fully documented  I have made any additions or clarifications directly to the above note. Agree with note above.  Presented with right hemiplegia due to left ACA occlusion and she also has right MCA as well as chronic basilar occlusions as well due to differences in technical atherosclerosis. Recommend dual antiplatelet therapy and ongoing stroke workup and aggressive risk factor modification. Long discussion with the daughter-in-law as well as son regarding her prognosis and plan of care and answered questions. Greater than 50% time during this 35 minute visit was spent on counseling and coordination of care about her stroke and plan of care. Discussed with Dr. Marthenia Rolling.Stroke team will sign off. F/U as outpt in stroke clinic in 6 weeks  Antony Contras, [place] Pager: [phone] 06/11/2018 4:34 PM  To contact Stroke Continuity provider, please refer  to [email or web address] After hours,  contact General Neurology

## 2018-06-11 NOTE — Progress Notes (Signed)
Returned to floor from MRI [id number]. Stacy Hendricks

## 2018-06-12 DIAGNOSIS — G9341 Metabolic encephalopathy: Secondary | ICD-10-CM

## 2018-06-12 LAB — RENAL FUNCTION PANEL
Albumin: 3.1 g/dL — ABNORMAL LOW (ref 3.5–5.0)
Anion gap: 9 (ref 5–15)
BUN: 9 mg/dL (ref 8–23)
CO2: 19 mmol/L — ABNORMAL LOW (ref 22–32)
Calcium: 8.3 mg/dL — ABNORMAL LOW (ref 8.9–10.3)
Chloride: 111 mmol/L (ref 98–111)
Creatinine, Ser: 1.48 mg/dL — ABNORMAL HIGH (ref 0.44–1.00)
GFR calc Af Amer: 35 mL/min — ABNORMAL LOW (ref >60)
GFR calc non Af Amer: 31 mL/min — ABNORMAL LOW (ref >60)
Glucose, Bld: 78 mg/dL (ref 70–99)
Phosphorus: 2.4 mg/dL — ABNORMAL LOW (ref 2.5–4.6)
Potassium: 3.6 mmol/L (ref 3.5–5.1)
Sodium: 139 mmol/L (ref 135–145)

## 2018-06-12 LAB — GLUCOSE, CAPILLARY
GLUCOSE-CAPILLARY: 113 mg/dL — AB (ref 70–99)
GLUCOSE-CAPILLARY: 58 mg/dL — AB (ref 70–99)
GLUCOSE-CAPILLARY: 62 mg/dL — AB (ref 70–99)
Glucose-Capillary: 102 mg/dL — ABNORMAL HIGH (ref 70–99)
Glucose-Capillary: 107 mg/dL — ABNORMAL HIGH (ref 70–99)
Glucose-Capillary: 71 mg/dL (ref 70–99)
Glucose-Capillary: 80 mg/dL (ref 70–99)

## 2018-06-12 LAB — HEMOGLOBIN A1C
Hgb A1c MFr Bld: 7.8 % — ABNORMAL HIGH (ref 4.8–5.6)
Mean Plasma Glucose: 177 mg/dL

## 2018-06-12 LAB — MAGNESIUM: Magnesium: 2 mg/dL (ref 1.7–2.4)

## 2018-06-12 MED ORDER — POTASSIUM CHLORIDE IN NACL 20-0.9 MEQ/L-% IV SOLN
INTRAVENOUS | Status: DC
  Administered 2018-06-12: 17:00:00 via INTRAVENOUS
  Filled 2018-06-12 (×4): qty 1000

## 2018-06-12 MED ORDER — LEVOTHYROXINE SODIUM 75 MCG PO TABS
37.5000 ug | ORAL_TABLET | Freq: Every day | ORAL | Status: DC
  Administered 2018-06-12 – 2018-06-13 (×2): 37.5 ug via ORAL
  Filled 2018-06-12 (×3): qty 1

## 2018-06-12 MED ORDER — GLUCAGON HCL RDNA (DIAGNOSTIC) 1 MG IJ SOLR
INTRAMUSCULAR | Status: AC
  Administered 2018-06-12: 17:00:00
  Filled 2018-06-12: qty 1

## 2018-06-12 NOTE — Progress Notes (Signed)
Palliative Medicine consult noted. Due to high referral volume, there may be a delay seeing this patient. Please call the Palliative Medicine Team office at [phone] if recommendations are needed in the interim.  Thank you for inviting [place] to see this patient.  Marjie Skiff Recardo Linn, RN, BSN, [facility] Palliative Medicine Team 06/12/2018 10:16 AM Office [phone]

## 2018-06-12 NOTE — Progress Notes (Signed)
CBG 62, no symptoms of low BS, administered 4oz coke & encouraged glucerna 4oz.  Tolerated well with constant encouragement.  Will monitor closely.

## 2018-06-12 NOTE — Progress Notes (Signed)
PROGRESS NOTE    Teira Arcilla  YQM:[id number] DOB: July 02, 1929 DOA: 06/10/2018 PCP: Crist Infante, MD  Outpatient Specialists:   Brief Narrative:  Patient is an 84 year old woman with medical problems including coronary artery disease status post stent placement over 5 years ago, chronic kidney disease with baseline creatinine between 1.5 and 2, insulin-dependent diabetes, CML on TKI therapy, hypertension, bilateral eye blindness.  Patient is admitted with right-sided weakness and poor p.o. intake.  MRI of the brain revealed multifocal left ACA territory infarct.  Potassium was 3.2.  EKG done revealed prolonged QTc interval (561 ms).  Patient was on Nelotinib (Tasigna) prior to admission, but currently on hold.  We will continue to hold Tasigna until hypokalemia prolonged QTc interval resolved.  Will monitor EKG and electrolytes.  Will have a low threshold to consult oncology team.  Work-up is in progress.  We will also have a low threshold to consult palliative care team.  06/12/2018: Patient looks better today.  Patient is still confused, and had pulled out her IV access earlier.  Hypokalemia is resolving.  Prolonged QTC has resolved.  We will continue to monitor.  Will consult pharmacy to restart patient's Tasigna.  Right-sided hemiparesis/hemiplegia persists.  Assessment & Plan:   Active Problems:   CVA (cerebral vascular accident) ([place])   Acute CVA: Course: sought medical attention from SNF, found to have right sided weakness and right sided facial droop; CT head without hemorrhage; although no known hx of CVA, brain imaging revealed evidence of prior ischemic changes; neurology team consulted - recommended admission and stroke work up as well as evaluation for other acute medical problems A/P:  Will provide ASA, NPO until safe swallow status ensured (or alternative route of administration), PT/OT, MRI of brain, carotid dopplers, TTE, and telemetry to further eval etiology and evaluate,  permissive HTN, risk stratification - A1c + lipid profile  06/12/2018: MRI brain revealed multifocal left ACA territory infarct.  Neurology input is appreciated.  Continue aspirin 81 mg p.o. once daily and Plavix.  As per neurology team, patient will continue dual antiplatelet therapy for 3 months, then change to aspirin alone.  Acute versus acute on chronic encephalopathy, probably metabolic: May be related to volume status, stroke, underlying cognitive disorder, depression Follow results of B12. TSH is low (0.254). Will restart home Synthroid, but we decreased the dose from 50 MCG p.o. once daily to 37.5 MCG p.o. once daily.   Continue IV fluids.  Continue to assess and monitor volume status. Culture thick yellowish drainage from the left eye. 06/12/2018: Continue to assess and manage expectantly.  Patient is slowly improving.  Diabetes mellitus: Last A1c in 04/2018 of 6.7 Continue long-acting insulin and sliding scale coverage (subcutaneous Lantus insulin 10 units nightly and SSI coverage ACHS)  Hypokalemia: K-Dur 40 M EQ p.o. x1 dose. Monitor closely while on IV fluids. Optimize potassium level is QTc is prolonged We will also need to be careful, considering advanced chronic kidney disease (CKD stage IV) Check magnesium level. EKG to monitor QTC interval.   06/12/2018: Potassium was 3.6 today.  Magnesium was 2.  QTc interval is within normal range.  We will continue to monitor closely.  Prolonged QTc interval: EKG done on presentation revealed QTc interval of 561 ms Correct low potassium Follow magnesium level Continue to hold nilotinib for now. EKG daily. Low threshold to consult oncology team. 06/12/2018: QTC interval is within normal limits.  Continue to monitor place.  Optimize electrolytes.   HTN and hx of CAD: was on  BB, CCB, statin, plavix PTA, holding currently given NPO status  CKD stage IV:  Stable.  Patient is at baseline.   Continue to monitor.  Anemia: Likely  multifactorial. Continue to monitor.   CML:  Followed by onc outpatient On Nilotinib 200 mg po q 12 Pharmacy consulted to restart nilotinib as prolonged QTc interval has resolved, and hypokalemia has resolved significantly. Patient did have detectable CML transcript at last value Low threshold to consult oncology.  Blindness / glaucoma:  Continue eye gtts.   Follow eyes for culture.  Depression / adjustment disorder:  SSRI therapy, re-start when able Patient may be failing to thrive. Low threshold to consult palliative care team.  Decreased PO intake:  Continue to monitor.   Low threshold to consult dietitian.   May improve as encephalopathy and volume depletion improve.   Be component of failure to thrive.   Low threshold to consult palliative care team as documented above.   Patient was on Remeron prior to admission.     DVT prophylaxis: Subq Hep Code Status: DNR / do not intubate Disposition Plan: Anticipate D/C to SNF  Consults called: neurology team.  But have low threshold to consult oncology, palliative care and dietary team.  Procedures:   Echo revealed EF of 55 to 60%.  Grade 1 diastolic dysfunction reported.  Antimicrobials:   None   Subjective: No new complaints. Right-sided weakness persists. Altered mental status is improving. Patient is more communicative today.   Objective: Vitals:   06/11/18 1949 06/11/18 2355 06/12/18 0337 06/12/18 0922  BP: (!) 161/101 (!) 128/56 (!) 141/42 (!) 147/109  Pulse: 65 67 (!) 58 76  Resp: (!) 21 12 14    Temp: 98.4 F (36.9 C) 98.2 F (36.8 C) 98.1 F (36.7 C) 98.5 F (36.9 C)  TempSrc: Axillary Axillary Axillary Axillary  SpO2: 98% 100% 100% 100%  Weight:      Height:        Intake/Output Summary (Last 24 hours) at 06/12/2018 1904 Last data filed at 06/12/2018 1632 Gross per 24 hour  Intake -  Output 1700 ml  Net -1700 ml   Filed Weights   06/10/18 1803 06/10/18 2242  Weight: 45.1 kg 52.8 kg     Examination:  General exam: Appears calm and comfortable.  Yellowish discharge from the left eye.   Respiratory system: Clear to auscultation.  Cardiovascular system: S1 & S2, with systolic murmur.  No pedal edema. Gastrointestinal system: Abdomen is nondistended, soft and nontender. No organomegaly or masses felt. Normal bowel sounds heard. Central nervous system: Right-sided hemiparesis.   Extremities: No leg edema.    Data Reviewed: I have personally reviewed following labs and imaging studies  CBC: Recent Labs  Lab 06/10/18 1850 06/10/18 1854 06/11/18 0506  WBC 5.5  --  4.2  NEUTROABS 3.9  --   --   HGB 10.0* 10.2* 9.1*  HCT 31.6* 30.0* 28.0*  MCV 91.6  --  88.9  PLT 141*  --  517*   Basic Metabolic Panel: Recent Labs  Lab 06/10/18 1850 06/10/18 1854 06/11/18 0506 06/12/18 1055  NA 139 140 141 139  K 3.7 3.7 3.2* 3.6  CL 110 107 109 111  CO2 22  --  24 19*  GLUCOSE 243* 234* 151* 78  BUN 15 17 12 9   CREATININE 2.03* 2.20* 1.73* 1.48*  CALCIUM 8.2*  --  8.1* 8.3*  MG  --   --   --  2.0  PHOS  --   --   --  2.4*   GFR: Estimated Creatinine Clearance: 22.3 mL/min (A) (by C-G formula based on SCr of 1.48 mg/dL (H)). Liver Function Tests: Recent Labs  Lab 06/10/18 1850 06/11/18 0506 06/12/18 1055  AST 23 21  --   ALT 22 20  --   ALKPHOS 156* 140*  --   BILITOT 1.1 0.9  --   PROT 6.3* 5.6*  --   ALBUMIN 3.0* 2.7* 3.1*   No results for input(s): LIPASE, AMYLASE in the last 168 hours. Recent Labs  Lab 06/11/18 0506  AMMONIA 17   Coagulation Profile: Recent Labs  Lab 06/10/18 1850  INR 1.07   Cardiac Enzymes: No results for input(s): CKTOTAL, CKMB, CKMBINDEX, TROPONINI in the last 168 hours. BNP (last 3 results) No results for input(s): PROBNP in the last 8760 hours. HbA1C: Recent Labs    06/11/18 0506  HGBA1C 7.8*   CBG: Recent Labs  Lab 06/11/18 2214 06/12/18 0625 06/12/18 1156 06/12/18 1648 06/12/18 1808  GLUCAP 113* 80 71 58*  107*   Lipid Profile: Recent Labs    06/11/18 0506  CHOL 108  HDL 37*  LDLCALC 43  TRIG 139  CHOLHDL 2.9   Thyroid Function Tests: Recent Labs    06/11/18 0506  TSH 0.254*   Anemia Panel: Recent Labs    06/11/18 0506  VITAMINB12 576  FERRITIN 115  TIBC 221*  IRON 53  RETICCTPCT 1.9   Urine analysis:    Component Value Date/Time   COLORURINE AMBER (A) 06/10/2018 1923   APPEARANCEUR HAZY (A) 06/10/2018 1923   LABSPEC 1.018 06/10/2018 1923   PHURINE 5.0 06/10/2018 1923   GLUCOSEU 150 (A) 06/10/2018 [place] NEGATIVE 06/10/2018 [place] NEGATIVE 06/10/2018 1923   KETONESUR 5 (A) 06/10/2018 1923   PROTEINUR 30 (A) 06/10/2018 1923   NITRITE NEGATIVE 06/10/2018 1923   LEUKOCYTESUR NEGATIVE 06/10/2018 1923   Sepsis Labs: @LABRCNTIP (procalcitonin:4,lacticidven:4)  ) Recent Results (from the past 240 hour(s))  MRSA PCR Screening     Status: None   Collection Time: 06/10/18 10:50 PM  Result Value Ref Range Status   MRSA by PCR NEGATIVE NEGATIVE Final    Comment:        The GeneXpert MRSA Assay (FDA approved for NASAL specimens only), is one component of a comprehensive MRSA colonization surveillance program. It is not intended to diagnose MRSA infection nor to guide or monitor treatment for MRSA infections. Performed at [facility], [place] [address], [place], [place] [address]   Aerobic Culture (superficial specimen)     Status: None (Preliminary result)   Collection Time: 06/11/18 11:25 AM  Result Value Ref Range Status   Specimen Description EYE  Final   Special Requests NONE  Final   Gram Stain   Final    RARE WBC PRESENT,BOTH PMN AND MONONUCLEAR FEW GRAM POSITIVE RODS RARE GRAM POSITIVE COCCI IN PAIRS Performed at [facility], [address], [place], [place] [address]    Culture FEW STAPHYLOCOCCUS AUREUS  Final   Report Status PENDING  Incomplete         Radiology Studies: Mr Brain 28 Contrast  Result Date:  06/11/2018 CLINICAL DATA:  Worsening confusion, RIGHT-sided weakness. History of stroke, blindness, hypertension and hyperlipidemia. EXAM: MRI HEAD WITHOUT CONTRAST MRA HEAD WITHOUT CONTRAST TECHNIQUE: Multiplanar, multiecho pulse sequences of the brain and surrounding structures were obtained without intravenous contrast. Angiographic images of the head were obtained using MRA technique without contrast. COMPARISON:  CT HEAD June 10, 2018 FINDINGS: MRI HEAD FINDINGS-moderately motion degraded examination. INTRACRANIAL CONTENTS: Patchy reduced diffusion mesial LEFT frontal parietal lobes including splenium of the corpus callosum with low ADC values. Susceptibility artifact LEFT lateral ventricle versus thalamus. A few scattered chronic microhemorrhages. LEFT frontal parietoccipital lobe encephalomalacia with ex vacuo dilatation LEFT occipital horn. Moderate to severe parenchymal brain volume loss. Old small LEFT cerebellar infarcts. Small area of RIGHT temporal lobe encephalomalacia. Prominent basal ganglia perivascular spaces associated chronic small vessel ischemic changes. Patchy supratentorial white matter FLAIR T2 hyperintensities. No midline shift, mass effect or masses. No abnormal extra-axial fluid collections. VASCULAR: Normal major intracranial vascular flow voids present at skull base. SKULL AND UPPER CERVICAL SPINE: No abnormal sellar expansion. No suspicious calvarial bone marrow signal. Craniocervical junction maintained. SINUSES/ORBITS: The mastoid air-cells and included paranasal sinuses are well-aerated.The included ocular globes and orbital contents are non-suspicious. OTHER: Patient is edentulous. MRA HEAD FINDINGS ANTERIOR CIRCULATION: Normal flow related enhancement of the included cervical, petrous, cavernous and supraclinoid internal carotid arteries. Patent anterior communicating artery. LEFT A2 occlusion with reconstitution within 1 cm. Occluded LEFT M1 segments with reconstitution.  Tandem severe stenosis anterior middle cerebral arteries. No aneurysm. POSTERIOR CIRCULATION: No LEFT vertebral artery flow related enhancement. Patent RIGHT vertebral artery and basilar artery. Severe stenosis bilateral SCA. Occluded distal basilar artery. Thready flow related enhancement LEFT posterior cerebral artery from LEFT PCOM. Patent RIGHT posterior cerebral artery via posterior communicating artery. Tandem severe stenosis posterior cerebral arteries. No aneurysm. ANATOMIC VARIANTS: None. Source images and MIP images were reviewed. IMPRESSION: MRI HEAD: 1. Moderately motion degraded examination. Acute multifocal LEFT ACA territory nonhemorrhagic infarcts. 2. Old LEFT MCA/posterior border zone territory infarcts. Old RIGHT temporal/MCA territory infarct. Old small LEFT cerebellar infarct. 3. Moderate chronic small vessel ischemic changes. 4. Moderate to severe parenchymal brain volume loss. MRA HEAD: 1. Emergent LEFT A2 occlusion with reconstitution. 2. LEFT M1 occlusion with reconstitution, likely chronic. 3. Occluded basilar tip, tenuous PCA filling from posterior communicating arteries. 4. Slow flow versus occluded LEFT vertebral artery. 5. Severe intracranial atherosclerosis. These results will be called to the ordering clinician or representative by the Radiologist Assistant, and communication documented in the PACS or zVision Dashboard. Electronically Signed   By: Elon Alas M.D.   On: 06/11/2018 03:18   Dg Chest Portable 1 View  Result Date: 06/10/2018 CLINICAL DATA:  Cough. EXAM: PORTABLE CHEST 1 VIEW COMPARISON:  None. FINDINGS: Patient is rotated to the left. Lungs are adequately inflated without consolidation or effusion. Mild increased density over the midline neck base at the level of the sternal notch likely due to goiter. Mild cardiomegaly. Degenerative change of the spine. IMPRESSION: No acute cardiopulmonary disease. Mild cardiomegaly. Electronically Signed   By: Marin Olp  M.D.   On: 06/10/2018 20:02   Mr Jodene Nam Head Wo Contrast  Result Date: 06/11/2018 CLINICAL DATA:  Worsening confusion, RIGHT-sided weakness. History of stroke, blindness, hypertension and hyperlipidemia. EXAM: MRI HEAD WITHOUT CONTRAST MRA HEAD WITHOUT CONTRAST TECHNIQUE: Multiplanar, multiecho pulse sequences of the brain and surrounding structures were obtained without intravenous contrast. Angiographic images of the head were obtained using MRA technique without contrast. COMPARISON:  CT HEAD June 10, 2018 FINDINGS: MRI HEAD FINDINGS-moderately motion degraded examination. INTRACRANIAL CONTENTS: Patchy reduced diffusion mesial LEFT frontal parietal lobes including splenium of the corpus callosum with low ADC values. Susceptibility artifact LEFT lateral ventricle versus thalamus. A few scattered chronic microhemorrhages. LEFT frontal parietoccipital lobe encephalomalacia with ex vacuo dilatation LEFT occipital horn. Moderate to severe parenchymal brain  volume loss. Old small LEFT cerebellar infarcts. Small area of RIGHT temporal lobe encephalomalacia. Prominent basal ganglia perivascular spaces associated chronic small vessel ischemic changes. Patchy supratentorial white matter FLAIR T2 hyperintensities. No midline shift, mass effect or masses. No abnormal extra-axial fluid collections. VASCULAR: Normal major intracranial vascular flow voids present at skull base. SKULL AND UPPER CERVICAL SPINE: No abnormal sellar expansion. No suspicious calvarial bone marrow signal. Craniocervical junction maintained. SINUSES/ORBITS: The mastoid air-cells and included paranasal sinuses are well-aerated.The included ocular globes and orbital contents are non-suspicious. OTHER: Patient is edentulous. MRA HEAD FINDINGS ANTERIOR CIRCULATION: Normal flow related enhancement of the included cervical, petrous, cavernous and supraclinoid internal carotid arteries. Patent anterior communicating artery. LEFT A2 occlusion with  reconstitution within 1 cm. Occluded LEFT M1 segments with reconstitution. Tandem severe stenosis anterior middle cerebral arteries. No aneurysm. POSTERIOR CIRCULATION: No LEFT vertebral artery flow related enhancement. Patent RIGHT vertebral artery and basilar artery. Severe stenosis bilateral SCA. Occluded distal basilar artery. Thready flow related enhancement LEFT posterior cerebral artery from LEFT PCOM. Patent RIGHT posterior cerebral artery via posterior communicating artery. Tandem severe stenosis posterior cerebral arteries. No aneurysm. ANATOMIC VARIANTS: None. Source images and MIP images were reviewed. IMPRESSION: MRI HEAD: 1. Moderately motion degraded examination. Acute multifocal LEFT ACA territory nonhemorrhagic infarcts. 2. Old LEFT MCA/posterior border zone territory infarcts. Old RIGHT temporal/MCA territory infarct. Old small LEFT cerebellar infarct. 3. Moderate chronic small vessel ischemic changes. 4. Moderate to severe parenchymal brain volume loss. MRA HEAD: 1. Emergent LEFT A2 occlusion with reconstitution. 2. LEFT M1 occlusion with reconstitution, likely chronic. 3. Occluded basilar tip, tenuous PCA filling from posterior communicating arteries. 4. Slow flow versus occluded LEFT vertebral artery. 5. Severe intracranial atherosclerosis. These results will be called to the ordering clinician or representative by the Radiologist Assistant, and communication documented in the PACS or zVision Dashboard. Electronically Signed   By: Elon Alas M.D.   On: 06/11/2018 03:18        Scheduled Meds: . aspirin EC  81 mg Oral Daily  . brimonidine  1 drop Both Eyes BID   And  . timolol  1 drop Both Eyes BID  . chlorhexidine  15 mL Mouth Rinse BID  . clopidogrel  75 mg Oral Daily  . heparin  5,000 Units Subcutaneous Q8H  . insulin aspart  0-9 Units Subcutaneous TID WC  . insulin glargine  10 Units Subcutaneous QHS  . latanoprost  1 drop Both Eyes QHS  . levothyroxine  37.5 mcg Oral  QAC breakfast  . mouth rinse  15 mL Mouth Rinse q12n4p   Continuous Infusions: . 0.9 % NaCl with KCl 20 mEq / L 50 mL/hr at 06/12/18 1637     LOS: 2 days    Time spent: 35 minutes.    Dana Allan, MD  [facility] Pager #: [phone] 7PM-7AM contact night coverage as above

## 2018-06-12 NOTE — Progress Notes (Signed)
Pharmacy note: Nilotinib  84 yo female on nilotinib PTA for CML. Pharmacy was asked to resume her home Nilotinib (was on hold due to prolonged QTc)  -Pharmacy does not carry this medication and family is unable to provide it as the medication is supplied by the patient's nursing home. Noted that the palliative care team has been consulted. The patient's family is aware that we do not carry this medication and is okay with not re-starting at this time  Plan -Will hold off on resuming nilotinib -If the this medication needs to be restarted, it will need to be ordered -Please call pharmacy with any questions or concerns  Hildred Laser, PharmD Clinical Pharmacist Please check Amion for pharmacy contact number

## 2018-06-13 DIAGNOSIS — Z515 Encounter for palliative care: Secondary | ICD-10-CM

## 2018-06-13 LAB — CBC WITH DIFFERENTIAL/PLATELET
Abs Immature Granulocytes: 0.1 10*3/uL (ref 0.0–0.1)
Basophils Absolute: 0 10*3/uL (ref 0.0–0.1)
Basophils Relative: 1 %
Eosinophils Absolute: 0.1 10*3/uL (ref 0.0–0.7)
Eosinophils Relative: 3 %
HCT: 28.1 % — ABNORMAL LOW (ref 36.0–46.0)
Hemoglobin: 9.2 g/dL — ABNORMAL LOW (ref 12.0–15.0)
Immature Granulocytes: 2 %
Lymphocytes Relative: 18 %
Lymphs Abs: 0.9 10*3/uL (ref 0.7–4.0)
MCH: 29.3 pg (ref 26.0–34.0)
MCHC: 32.7 g/dL (ref 30.0–36.0)
MCV: 89.5 fL (ref 78.0–100.0)
Monocytes Absolute: 0.6 10*3/uL (ref 0.1–1.0)
Monocytes Relative: 12 %
Neutro Abs: 3.5 10*3/uL (ref 1.7–7.7)
Neutrophils Relative %: 66 %
Platelets: 120 10*3/uL — ABNORMAL LOW (ref 150–400)
RBC: 3.14 MIL/uL — ABNORMAL LOW (ref 3.87–5.11)
RDW: 15.9 % — ABNORMAL HIGH (ref 11.5–15.5)
WBC: 5.3 10*3/uL (ref 4.0–10.5)

## 2018-06-13 LAB — RENAL FUNCTION PANEL
Albumin: 2.6 g/dL — ABNORMAL LOW (ref 3.5–5.0)
Anion gap: 8 (ref 5–15)
BUN: 9 mg/dL (ref 8–23)
CO2: 21 mmol/L — ABNORMAL LOW (ref 22–32)
Calcium: 8.2 mg/dL — ABNORMAL LOW (ref 8.9–10.3)
Chloride: 110 mmol/L (ref 98–111)
Creatinine, Ser: 1.47 mg/dL — ABNORMAL HIGH (ref 0.44–1.00)
GFR calc Af Amer: 36 mL/min — ABNORMAL LOW (ref >60)
GFR calc non Af Amer: 31 mL/min — ABNORMAL LOW (ref >60)
Glucose, Bld: 67 mg/dL — ABNORMAL LOW (ref 70–99)
Phosphorus: 2.8 mg/dL (ref 2.5–4.6)
Potassium: 3.6 mmol/L (ref 3.5–5.1)
Sodium: 139 mmol/L (ref 135–145)

## 2018-06-13 LAB — GLUCOSE, CAPILLARY
GLUCOSE-CAPILLARY: 137 mg/dL — AB (ref 70–99)
Glucose-Capillary: 46 mg/dL — ABNORMAL LOW (ref 70–99)
Glucose-Capillary: 116 mg/dL — ABNORMAL HIGH (ref 70–99)
Glucose-Capillary: 57 mg/dL — ABNORMAL LOW (ref 70–99)
Glucose-Capillary: 77 mg/dL (ref 70–99)
Glucose-Capillary: 53 mg/dL — ABNORMAL LOW (ref 70–99)
Glucose-Capillary: 180 mg/dL — ABNORMAL HIGH (ref 70–99)

## 2018-06-13 LAB — MAGNESIUM: Magnesium: 2 mg/dL (ref 1.7–2.4)

## 2018-06-13 MED ORDER — DOCUSATE SODIUM 100 MG PO CAPS
100.0000 mg | ORAL_CAPSULE | Freq: Every day | ORAL | Status: DC
  Administered 2018-06-13: 100 mg via ORAL
  Filled 2018-06-13 (×2): qty 1

## 2018-06-13 MED ORDER — ROSUVASTATIN CALCIUM 5 MG PO TABS
10.0000 mg | ORAL_TABLET | Freq: Every day | ORAL | Status: DC
  Administered 2018-06-13 – 2018-06-14 (×2): 10 mg via ORAL
  Filled 2018-06-13 (×2): qty 2

## 2018-06-13 MED ORDER — CARVEDILOL 6.25 MG PO TABS
6.2500 mg | ORAL_TABLET | Freq: Two times a day (BID) | ORAL | Status: DC
  Administered 2018-06-13 – 2018-06-14 (×2): 6.25 mg via ORAL
  Filled 2018-06-13 (×2): qty 1

## 2018-06-13 MED ORDER — ESCITALOPRAM OXALATE 10 MG PO TABS
20.0000 mg | ORAL_TABLET | Freq: Every day | ORAL | Status: DC
  Administered 2018-06-13 – 2018-06-14 (×2): 20 mg via ORAL
  Filled 2018-06-13 (×2): qty 2

## 2018-06-13 MED ORDER — POLYMYXIN B-TRIMETHOPRIM 10000-0.1 UNIT/ML-% OP SOLN
1.0000 [drp] | Freq: Four times a day (QID) | OPHTHALMIC | Status: DC
  Administered 2018-06-13 – 2018-06-14 (×3): 1 [drp] via OPHTHALMIC
  Filled 2018-06-13: qty 10

## 2018-06-13 MED ORDER — ENSURE ENLIVE PO LIQD
237.0000 mL | Freq: Two times a day (BID) | ORAL | Status: DC
  Administered 2018-06-13 – 2018-06-14 (×2): 237 mL via ORAL

## 2018-06-13 MED ORDER — GLUCOSE 40 % PO GEL
ORAL | Status: AC
  Administered 2018-06-13: 37.5 g
  Filled 2018-06-13: qty 1

## 2018-06-13 MED ORDER — MIRTAZAPINE 15 MG PO TABS: 7.5000 mg | ORAL_TABLET | Freq: Every day | ORAL | Status: DC

## 2018-06-13 MED ORDER — GLUCOSE 40 % PO GEL
ORAL | Status: AC
  Filled 2018-06-13: qty 1

## 2018-06-13 MED ORDER — ENSURE PO LIQD: 237.0000 mL | Freq: Two times a day (BID) | ORAL | Status: DC

## 2018-06-13 NOTE — Progress Notes (Signed)
Hypoglycemic Event  CBG: 46  Treatment:   Oral glucose gel 15gm, followed by sips of regular coke 100cc  Symptoms:  sleepy, arousable, cooperative, follows directions  Follow-up CBG: Time:  7371 = 116  Possible Reasons for Event: Poor appetite for past 24-48hr.   Comments/MD notified: yes, paged    Michaela Corner

## 2018-06-13 NOTE — Progress Notes (Signed)
Inpatient Diabetes Program Recommendations  AACE/ADA: New Consensus Statement on Inpatient Glycemic Control (2015)  Target Ranges:  Prepandial:   less than 140 mg/dL      Peak postprandial:   less than 180 mg/dL (1-2 hours)      Critically ill patients:  140 - 180 mg/dL   Lab Results  Component Value Date   GLUCAP 116 (H) 06/13/2018   HGBA1C 7.8 (H) 06/11/2018    Review of Glycemic Control Results for CRISOL, MUECKE (MRN [id number]) as of 06/13/2018 10:34  Ref. Range 06/12/2018 22:18 06/12/2018 23:53 06/13/2018 06:21 06/13/2018 06:37 06/13/2018 06:52  Glucose-Capillary Latest Ref Range: 70 - 99 mg/dL 62 (L) 102 (H) 46 (L) 57 (L) 116 (H)   Diabetes history: DM2 Outpatient Diabetes medications: Humalog mix 50/50- 20 units daily, Lantus 10 units q HS Current orders for Inpatient glycemic control:  Lantus 10 units q HS, Novolog sensitive tid with meals Inpatient Diabetes Program Recommendations:   Note hypoglycemia.  Please discontinue Lantus 10 units daily.  Text paged MD.   Thanks, Adah Perl, RN, BC-ADM Inpatient Diabetes Coordinator Pager [phone] (8a-5p)

## 2018-06-13 NOTE — Progress Notes (Signed)
Physical Therapy Treatment Patient Details Name: Stacy Hendricks MRN: [id number] DOB: 12-10-1928 Today's Date: 06/13/2018    History of Present Illness 84 year old woman with medical problems including CAD status post stent placement CKD, insulin-dependent DM, CML on TKI therapy, hypertension, bilateral eye blindness who presents with right sided weakness. No presents with acute left ACA versus ACA/MCA watershed stroke    PT Comments    Patient seen for activity progression. Tolerated therapeutic exercise intervention and OOB to chair activity with max assist. Unable to ambulate of come to full standing this session. Current POC remains appropriate. Patient continues to demonstrate overall confusion throughout session.   Follow Up Recommendations  SNF     Equipment Recommendations  None recommended by PT    Recommendations for Other Services       Precautions / Restrictions Precautions Precautions: Fall Restrictions Weight Bearing Restrictions: No    Mobility  Bed Mobility Overal bed mobility: Needs Assistance Bed Mobility: Rolling;Supine to Sit;Sit to Supine Rolling: Mod assist   Supine to sit: Max assist Sit to supine: Max assist;+2 for safety/equipment   General bed mobility comments: Moderate assist to roll to the right, patient able to utiize LUE to reach and grab for rails. Max assist to elevate trunk to upright at EOB and Max assist for controlled descent back to supine with +2 for repositioning  Transfers Overall transfer level: Needs assistance Equipment used: 1 person hand held assist Transfers: Sit to/from Stand;Lateral/Scoot Transfers Sit to Stand: Max assist        Lateral/Scoot Transfers: Max assist General transfer comment: Attmepted sit <> stand could not reach upright today, Max assist to lateral scoot from bed to chair. face to face transfer.   Ambulation/Gait             General Gait Details: unable to perform   Stairs              Wheelchair Mobility    Modified Rankin (Stroke Patients Only) Modified Rankin (Stroke Patients Only) Pre-Morbid Rankin Score: Moderately severe disability Modified Rankin: Severe disability     Balance Overall balance assessment: Needs assistance Sitting-balance support: Feet supported Sitting balance-Leahy Scale: Poor Sitting balance - Comments: Tolerate EOB with less physical assist this session Postural control: Right lateral lean   Standing balance-Leahy Scale: Zero                              Cognition Arousal/Alertness: Awake/alert Behavior During Therapy: Flat affect Overall Cognitive Status: Impaired/Different from baseline Area of Impairment: Orientation;Attention;Memory;Following commands;Safety/judgement;Awareness;Problem solving                 Orientation Level: Disoriented to;Place;Time;Situation Current Attention Level: Sustained Memory: Decreased short-term memory Following Commands: Follows one step commands inconsistently;Follows one step commands with increased time Safety/Judgement: Decreased awareness of safety;Decreased awareness of deficits Awareness: Intellectual Problem Solving: Slow processing;Decreased initiation;Difficulty sequencing;Requires verbal cues;Requires tactile cues        Exercises Other Exercises Other Exercises: PROM bilateral LEs, (grimacing and pain with ROM to right LE and foot    General Comments        Pertinent Vitals/Pain Pain Assessment: Faces Faces Pain Scale: Hurts little more Pain Location: head ache Pain Descriptors / Indicators: Grimacing;Moaning Pain Intervention(s): Monitored during session    Home Living                      Prior Function  PT Goals (current goals can now be found in the care plan section) Acute Rehab PT Goals Patient Stated Goal: none stated PT Goal Formulation: Patient unable to participate in goal setting Time For Goal Achievement:  06/25/18 Potential to Achieve Goals: Fair Progress towards PT goals: Progressing toward goals    Frequency    Min 2X/week      PT Plan Current plan remains appropriate    Co-evaluation              AM-PAC PT "6 Clicks" Daily Activity  Outcome Measure  Difficulty turning over in bed (including adjusting bedclothes, sheets and blankets)?: Unable Difficulty moving from lying on back to sitting on the side of the bed? : Unable Difficulty sitting down on and standing up from a chair with arms (e.g., wheelchair, bedside commode, etc,.)?: Unable Help needed moving to and from a bed to chair (including a wheelchair)?: Total Help needed walking in hospital room?: Total Help needed climbing 3-5 steps with a railing? : Total 6 Click Score: 6    End of Session   Activity Tolerance: Patient limited by fatigue Patient left: in bed;with call bell/phone within reach;with bed alarm set;with SCD's reapplied Nurse Communication: Mobility status PT Visit Diagnosis: Muscle weakness (generalized) (M62.81);Apraxia (R48.2);Other symptoms and signs involving the nervous system (R29.898)     Time: 4315-4008 PT Time Calculation (min) (ACUTE ONLY): 19 min  Charges:  $Therapeutic Activity: 8-22 mins                     Alben Deeds, PT DPT  Board Certified Neurologic Specialist [employer] Pager [phone] Office [phone]    Duncan Dull 06/13/2018, 11:50 AM

## 2018-06-13 NOTE — Progress Notes (Signed)
Patient Blood Glucose was 53 Nurse treated with 15 Grams of Glucose gel, I will recheck in 15 mins MD Gruz Notified Nurse will continue to monitor.

## 2018-06-13 NOTE — Care Management Important Message (Signed)
Important Message  Patient Details  Name: Stacy Hendricks MRN: [id number] Date of Birth: 1929/07/08   Medicare Important Message Given:  No  Correction: Patient did not sign [name] copy left   [name] [name] 06/13/2018, 3:04 PM

## 2018-06-13 NOTE — Progress Notes (Signed)
PROGRESS NOTE  Stacy Hendricks  RAQ:[id number] DOB: 02/08/1929 DOA: 06/10/2018 PCP: Crist Infante, MD   Brief Narrative: Stacy Hendricks is an 84 year old woman with history of CAD s/p PCI, stage IV CKD, IDDM, CML on TKI therapy, HTN, and glaucoma with bilateral blindness who presented to the ED with right-sided weakness as well as subacute poor per oral intake having recently moved to [place] with her husband who is 39 with advanced dementia. MRI of the brain revealed multifocal left ACA territory infarcts. She was hypokalemic with prolonged QT interval, both of which have resolved with treatment. Palliative care was consulted and continued goals of care discussions with the patient and her son. She is DNR with goal to focus on comfort, with plans to be followed by hosp[facility] at [facility] at discharge.  Assessment & Plan: Active Problems:   CVA (cerebral vascular accident) ([place])  Acute multifocal left ACA territory infarcts: Also noted older infarcts (L MCA, R temporal/MCA, L cerebellum). No CES on echo. Severe intracranial stenosis on MRA (L A2 ELVO. L M1 occlusion (chronic). Occluded basilar tp. tenusous PCA filling. L VA slow flow vs occlusion. Severe IC atherosclerosis). - ASA, plavix x3 months, then aspirin alone as recommended by neurology - Follow up with neurology in 6 weeks - Continue high intensity statin, LDL 43.  - Moderate aspiration risk. Based on goals of care discussions, will allow comfort feeds regardless and not pursue artifical means of feeding. - Continue SNF at discharge.  Acute metabolic encephalopathy: Multifactorial, improving. Due to underlying CVD and anterior acute CVAs, depression, possibly delirium as well. Suspect an element of vascular dementia due to family's report of preexisting cognitive impairment. - Monitor closely  Hypothyroidism: With low TSH at 0.254.  - Decreased synthroid dose 66mcg > 37.27mcg. Please monitor TFTs and clinical  response.  IDDM: Last HbA1c 6.7% indicating overly tight control for her age.  - DC long acting insulin with continuously low CBGs. Will possibly only requir tradjenta at discharge.  Stage IV CKD: Cr at/near baseline.  - Renally dose medications - Avoid nephrotoxins  CAD, HTN:  - Plavix, statin, BB. consider restarting CCB based on BP's  CML:  - Follow up with oncology as outpatient, continue TKI  Hypokalemia:  - Monitor and replace as indicated.   QT prolongation: Resolved 9/17.  Glaucoma with blindness:  - Home gtt's  Depression, adjustment disorder, poor per oral intake:  - Continue SSRI, remeron - Ensure BIDBM, consult dietitian  Left eye conjunctivitis: Culture may or may not be helpful, showing S. aureus.  - Trimethoprim-Polymixin gtt's x5-7 days.  DVT prophylaxis: Heparin Code Status: DNR Family Communication: None at bedside today Disposition Plan: SNF in next 24 hours.  Consultants:   Neurology  Palliative care  Procedures:   Echocardiogram 06/11/2018: - Left ventricle: The cavity size was normal. There was mild   concentric hypertrophy. Systolic function was normal. The   estimated ejection fraction was in the range of 55% to 60%. Wall   motion was normal; there were no regional wall motion   abnormalities. Doppler parameters are consistent with abnormal   left ventricular relaxation (grade 1 diastolic dysfunction). - Aortic valve: Trileaflet; moderately thickened, moderately   calcified leaflets. There was trivial regurgitation. - Mitral valve: Calcified annulus. - Left atrium: The atrium was mildly dilated. Volume/bsa, S: 40.9   ml/m^2.  Impressions: - No cardiac source of emboli was indentified  Antimicrobials:  Trimethoprim-ploymixin B ophthalmic gtt left eye 9/18 >>    Subjective: No complaints,  getting up with PT to the chair. No pain. Slow to respond.   Objective: Vitals:   06/13/18 0300 06/13/18 0323 06/13/18 0805 06/13/18 1220   BP: (!) 167/59 (!) 158/60 (!) 151/69 (!) 144/59  Pulse: (!) 59 62 66 65  Resp: 11 13 16 18   Temp:  98 F (36.7 C) 98.1 F (36.7 C) 97.8 F (36.6 C)  TempSrc:  Axillary Axillary Axillary  SpO2:  100% 98% 99%  Weight:      Height:        Intake/Output Summary (Last 24 hours) at 06/13/2018 1525 Last data filed at 06/13/2018 1300 Gross per 24 hour  Intake 1143.3 ml  Output 1700 ml  Net -556.7 ml   Filed Weights   06/10/18 1803 06/10/18 2242  Weight: 45.1 kg 52.8 kg    Gen: Frail, elderly female in no distress HEENT: Hazy corneas bilaterally, not responsive to light. Mile left eye injection with yellow discharge. Pulm: Non-labored breathing room air. Clear to auscultation bilaterally.  CV: Regular rate and rhythm. No murmur, rub, or gallop. No JVD, no pedal edema. GI: Abdomen soft, non-tender, non-distended, with normoactive bowel sounds. No organomegaly or masses felt. Ext: Warm, no deformities Skin: No rashes, lesions or ulcers Neuro: Alert, evades orientation questions. Right arm and leg 3/5 strength. Psych: Judgement and insight appear normal. Mood & affect appropriate.   Data Reviewed: I have personally reviewed following labs and imaging studies  CBC: Recent Labs  Lab 06/10/18 1850 06/10/18 1854 06/11/18 0506 06/13/18 0247  WBC 5.5  --  4.2 5.3  NEUTROABS 3.9  --   --  3.5  HGB 10.0* 10.2* 9.1* 9.2*  HCT 31.6* 30.0* 28.0* 28.1*  MCV 91.6  --  88.9 89.5  PLT 141*  --  144* 650*   Basic Metabolic Panel: Recent Labs  Lab 06/10/18 1850 06/10/18 1854 06/11/18 0506 06/12/18 1055 06/13/18 0247  NA 139 140 141 139 139  K 3.7 3.7 3.2* 3.6 3.6  CL 110 107 109 111 110  CO2 22  --  24 19* 21*  GLUCOSE 243* 234* 151* 78 67*  BUN 15 17 12 9 9   CREATININE 2.03* 2.20* 1.73* 1.48* 1.47*  CALCIUM 8.2*  --  8.1* 8.3* 8.2*  MG  --   --   --  2.0 2.0  PHOS  --   --   --  2.4* 2.8   GFR: Estimated Creatinine Clearance: 22.5 mL/min (A) (by C-G formula based on SCr of  1.47 mg/dL (H)). Liver Function Tests: Recent Labs  Lab 06/10/18 1850 06/11/18 0506 06/12/18 1055 06/13/18 0247  AST 23 21  --   --   ALT 22 20  --   --   ALKPHOS 156* 140*  --   --   BILITOT 1.1 0.9  --   --   PROT 6.3* 5.6*  --   --   ALBUMIN 3.0* 2.7* 3.1* 2.6*   No results for input(s): LIPASE, AMYLASE in the last 168 hours. Recent Labs  Lab 06/11/18 0506  AMMONIA 17   Coagulation Profile: Recent Labs  Lab 06/10/18 1850  INR 1.07   Cardiac Enzymes: No results for input(s): CKTOTAL, CKMB, CKMBINDEX, TROPONINI in the last 168 hours. BNP (last 3 results) No results for input(s): PROBNP in the last 8760 hours. HbA1C: Recent Labs    06/11/18 0506  HGBA1C 7.8*   CBG: Recent Labs  Lab 06/12/18 2353 06/13/18 0621 06/13/18 0637 06/13/18 0652 06/13/18 1120  GLUCAP 102* 46*  57* 116* 77   Lipid Profile: Recent Labs    06/11/18 0506  CHOL 108  HDL 37*  LDLCALC 43  TRIG 139  CHOLHDL 2.9   Thyroid Function Tests: Recent Labs    06/11/18 0506  TSH 0.254*   Anemia Panel: Recent Labs    06/11/18 0506  VITAMINB12 576  FERRITIN 115  TIBC 221*  IRON 53  RETICCTPCT 1.9   Urine analysis:    Component Value Date/Time   COLORURINE AMBER (A) 06/10/2018 1923   APPEARANCEUR HAZY (A) 06/10/2018 1923   LABSPEC 1.018 06/10/2018 1923   PHURINE 5.0 06/10/2018 1923   GLUCOSEU 150 (A) 06/10/2018 [place] NEGATIVE 06/10/2018 [place] NEGATIVE 06/10/2018 1923   KETONESUR 5 (A) 06/10/2018 1923   PROTEINUR 30 (A) 06/10/2018 1923   NITRITE NEGATIVE 06/10/2018 1923   LEUKOCYTESUR NEGATIVE 06/10/2018 1923   Recent Results (from the past 240 hour(s))  MRSA PCR Screening     Status: None   Collection Time: 06/10/18 10:50 PM  Result Value Ref Range Status   MRSA by PCR NEGATIVE NEGATIVE Final    Comment:        The GeneXpert MRSA Assay (FDA approved for NASAL specimens only), is one component of a comprehensive MRSA colonization surveillance  program. It is not intended to diagnose MRSA infection nor to guide or monitor treatment for MRSA infections. Performed at [facility], [place] [address] [place], [place] [address]   Aerobic Culture (superficial specimen)     Status: None (Preliminary result)   Collection Time: 06/11/18 11:25 AM  Result Value Ref Range Status   Specimen Description EYE  Final   Special Requests NONE  Final   Gram Stain   Final    RARE WBC PRESENT,BOTH PMN AND MONONUCLEAR FEW GRAM POSITIVE RODS RARE GRAM POSITIVE COCCI IN PAIRS Performed at [facility], [address], [place], [place] [address]    Culture FEW STAPHYLOCOCCUS AUREUS  Final   Report Status PENDING  Incomplete      Radiology Studies: No results found.  Scheduled Meds: . aspirin EC  81 mg Oral Daily  . brimonidine  1 drop Both Eyes BID   And  . timolol  1 drop Both Eyes BID  . chlorhexidine  15 mL Mouth Rinse BID  . clopidogrel  75 mg Oral Daily  . dextrose      . heparin  5,000 Units Subcutaneous Q8H  . insulin aspart  0-9 Units Subcutaneous TID WC  . latanoprost  1 drop Both Eyes QHS  . levothyroxine  37.5 mcg Oral QAC breakfast  . mouth rinse  15 mL Mouth Rinse q12n4p   Continuous Infusions: . 0.9 % NaCl with KCl 20 mEq / L 50 mL/hr at 06/13/18 1200     LOS: 3 days   Time spent: 25 minutes.  Patrecia Pour, MD [facility] [email or web address] Password [facility] 06/13/2018, 3:25 PM

## 2018-06-13 NOTE — Care Management Important Message (Signed)
Important Message  Patient Details  Name: Stacy Hendricks MRN: [id number] Date of Birth: 12/10/1928   Medicare Important Message Given:  Yes    [name] 06/13/2018, 3:01 PM

## 2018-06-13 NOTE — Consult Note (Signed)
Consultation Note Date: 06/13/2018   Patient Name: Stacy Hendricks  DOB: Jun 14, 1929  MRN: [id number]  Age / Sex: 84 y.o., female  PCP: Crist Infante, MD Referring Physician: Patrecia Pour, MD  Reason for Consultation: Establishing goals of care  HPI/Patient Profile: 84 y.o. female admitted on 06/10/2018  Clinical Assessment and Goals of Care:  84 year old lady with a past medical history significant for coronary artery disease status post stent placement over 5 years ago, chronic kidney disease, diabetes, history of CML, hypertension, bilateral eye blindness.  Patient has been living at [facility] facility for the past few months. Patient has been admitted with right-sided weakness and poor oral intake. She was seen by neurology. She was admitted for acute stroke. MRI of the brain revealed multifocal left ACA territory infarcts.  Patient is being followed by neurology. She has also been seen by speech therapy. A palliative consultation has been requested for goals of care discussions.  Patient is awake alert resting in bed. She has blindness. She is able to perceive and follow simple commands. There is no family present at the bedside.  Call placed and discussed with her son Stacy Hendricks at [phone]. I introduce myself and palliative care as follows: Palliative medicine is specialized medical care for people living with serious illness. It focuses on providing relief from the symptoms and stress of a serious illness. The goal is to improve quality of life for both the patient and the family.  Brief life review performed. Patient's son describes her as a very strong woman. Her and her husband lived independently in a condominium up until the past few months ago where they both had to be moved to [facility] facility. Patient's husband is 75 years old and has advanced dementia. Patient's  baseline is such that for the past month or 2, she was laying down more, son states it was almost as if she lost her will to live, she was not eating much, she was given an appetite boosting medication without much results, she has had ongoing decline.  Son is appreciative of the information he has received from neurology. We discussed about the patient's MRI in detail. Patient has evidence of a trophy, patient has evidence of old strokes, discussed about new stroke in detail. Goals wishes and values discussed in detail.  Patient's son states that he wishes to keep the patient and her husband together for as long as possible. We discussed about hospice philosophy of care. Different levels of hospice support discussed in detail. Discussed about focusing on keeping the patient as comfortable as possible rather than subjecting her to aggressive physical therapy or aggressive means of artificial nutrition and hydration such as placement of PEG tubes. Son does not want PEG tube, son states that the way he understands it, it is a miracle that she has survived with having this leukemia, she states that she might have been living on borrowed time.  It has been determined that the best course of action to take would be for  the patient to have hospice follow her at [facility] upon discharge. To focus more on comfort measures and comfort feeds understanding moderate aspiration risk. We will request social worker consultation to help facilitate. Thank you for the consult.  NEXT OF KIN  husband who is 6 years old and has dementia.  Patient's only child Stacy Hendricks is her primary Media planner.   SUMMARY OF RECOMMENDATIONS    DNR/DNI discussed and reconfirmed with son, no aggressive heroic measures, no PEG tube Hospice to follow the patient at [facility] facility on discharge.  Continue gentle treatments, current mode of care.  Comfort feeds with assistance as much as possible, son  understands moderate aspiration risk.  Thank you for the consult.  Code Status/Advance Care Planning:  DNR    Symptom Management:    continue current mode of care.   Palliative Prophylaxis:   Delirium Protocol  Psycho-social/Spiritual:   Desire for further Chaplaincy support:yes  Additional Recommendations: Education on Hospice  Prognosis:   < 3 months  Discharge Planning: [place] with Hospice      Primary Diagnoses: Present on Admission: . CVA (cerebral vascular accident) ([place])   I have reviewed the medical record, interviewed the patient and family, and examined the patient. The following aspects are pertinent.  Past Medical History:  Diagnosis Date  . Anemia   . Chronic kidney disease   . CML (chronic myeloid leukemia) ([place])   . Depression   . Diabetes mellitus without complication ([place])   . Hyperlipidemia   . Hypertension   . Thyroid disease    Social History   Socioeconomic History  . Marital status: Married    Spouse name: Not on file  . Number of children: Not on file  . Years of education: Not on file  . Highest education level: Not on file  Occupational History  . Not on file  Social Needs  . Financial resource strain: Not on file  . Food insecurity:    Worry: Not on file    Inability: Not on file  . Transportation needs:    Medical: Not on file    Non-medical: Not on file  Tobacco Use  . Smoking status: Never Smoker  . Smokeless tobacco: Never Used  Substance and Sexual Activity  . Alcohol use: Never    Frequency: Never  . Drug use: Never  . Sexual activity: Not on file  Lifestyle  . Physical activity:    Days per week: Not on file    Minutes per session: Not on file  . Stress: Not on file  Relationships  . Social connections:    Talks on phone: Not on file    Gets together: Not on file    Attends religious service: Not on file    Active member of club or organization: Not on file    Attends meetings of  clubs or organizations: Not on file    Relationship status: Not on file  Other Topics Concern  . Not on file  Social History Narrative  . Not on file   Family History  Problem Relation Age of Onset  . Heart attack Father    Scheduled Meds: . aspirin EC  81 mg Oral Daily  . brimonidine  1 drop Both Eyes BID   And  . timolol  1 drop Both Eyes BID  . chlorhexidine  15 mL Mouth Rinse BID  . clopidogrel  75 mg Oral Daily  . dextrose      .  heparin  5,000 Units Subcutaneous Q8H  . insulin aspart  0-9 Units Subcutaneous TID WC  . latanoprost  1 drop Both Eyes QHS  . levothyroxine  37.5 mcg Oral QAC breakfast  . mouth rinse  15 mL Mouth Rinse q12n4p   Continuous Infusions: . 0.9 % NaCl with KCl 20 mEq / L 50 mL/hr at 06/12/18 1637   PRN Meds:.acetaminophen **OR** acetaminophen (TYLENOL) oral liquid 160 mg/5 mL **OR** acetaminophen Medications Prior to Admission:  Prior to Admission medications   Medication Sig Start Date End Date Taking? Authorizing Provider  amLODipine (NORVASC) 10 MG tablet Take 10 mg by mouth daily.  05/02/18  Yes [provider]  carvedilol (COREG) 6.25 MG tablet Take 6.25 mg by mouth 2 (two) times daily with a meal.  05/30/18  Yes [provider]  clopidogrel (PLAVIX) 75 MG tablet Take 75 mg by mouth daily.    Yes [provider]  COMBIGAN 0.2-0.5 % ophthalmic solution Place 1 drop into both eyes every 12 (twelve) hours.  09/23/14  Yes [provider]  docusate sodium (COLACE) 100 MG capsule Take 100 mg by mouth daily. Give 1 capsule by mouth daily  05/04/18  Yes [provider]  ENSURE (ENSURE) Give 120 ml two times daily between meals 06/04/18  Yes [provider]  escitalopram (LEXAPRO) 20 MG tablet Take 20 mg by mouth daily.  05/09/18  Yes [provider]  HUMALOG MIX 50/50 KWIKPEN (50-50) 100 UNIT/ML Kwikpen Inject 20 Units into the skin daily. Hold for cbg <100 08/28/14  Yes [provider]    insulin glargine (LANTUS) 100 UNIT/ML injection Inject 10 Units into the skin at bedtime.   Yes [provider]  levothyroxine (SYNTHROID, LEVOTHROID) 50 MCG tablet Take 50 mcg by mouth daily before breakfast.  09/20/14  Yes [provider]  linagliptin (TRADJENTA) 5 MG TABS tablet Take 5 mg by mouth daily.   Yes [provider]  LUMIGAN 0.01 % SOLN Place 1 drop into both eyes at bedtime.  09/23/14  Yes [provider]  mirtazapine (REMERON) 7.5 MG tablet Take 7.5 mg by mouth at bedtime. 05/30/18  Yes [provider]  nilotinib (TASIGNA) 200 MG capsule Take 2 capsules (400 mg total) by mouth every 12 (twelve) hours. Give on an empty stomach 1 hour before or 2 hours after meals. 04/30/18  Yes Magrinat, Virgie Dad, MD  polyethylene glycol (MIRALAX / GLYCOLAX) packet Take 17 g by mouth daily. And as needed 05/04/18  Yes [provider]  rosuvastatin (CRESTOR) 10 MG tablet Take 10 mg by mouth daily.    Yes [provider]   No Known Allergies Review of Systems Confused  Physical Exam Weak frail elderly lady resting in bed In no distress Abdomen soft Shallow clear breath sounds S1-S2 Has a right-sided weakness Does not have peripheral edema Has blindness  Vital Signs: BP (!) 151/69 (BP Location: Right Arm)   Pulse 66   Temp 98.1 F (36.7 C) (Axillary)   Resp 16   Ht 5\' 4"  (1.626 m)   Wt 52.8 kg   SpO2 98%   BMI 19.98 kg/m  Pain Scale: 0-10   Pain Score: 0-No pain   SpO2: SpO2: 98 % O2 Device:SpO2: 98 % O2 Flow Rate: .   IO: Intake/output summary:   Intake/Output Summary (Last 24 hours) at 06/13/2018 1221 Last data filed at 06/13/2018 0900 Gross per 24 hour  Intake 644.13 ml  Output 1300 ml  Net -655.87  ml    LBM: Last BM Date: 06/11/18 Baseline Weight: Weight: 45.1 kg Most recent weight: Weight: 52.8 kg     Palliative Assessment/Data:   PPS 30%  Time In:  12 Time Out:  1310 Time Total:  70 min  Greater  than 50%  of this time was spent counseling and coordinating care related to the above assessment and plan.  Signed by: Loistine Chance, MD  [phone]  Please contact Palliative Medicine Team phone at [phone] for questions and concerns.  For individual provider: See Shea Evans

## 2018-06-14 LAB — AEROBIC CULTURE  (SUPERFICIAL SPECIMEN)

## 2018-06-14 LAB — GLUCOSE, CAPILLARY
GLUCOSE-CAPILLARY: 121 mg/dL — AB (ref 70–99)
GLUCOSE-CAPILLARY: 128 mg/dL — AB (ref 70–99)

## 2018-06-14 LAB — BASIC METABOLIC PANEL
ANION GAP: 5 (ref 5–15)
BUN: 9 mg/dL (ref 8–23)
CO2: 24 mmol/L (ref 22–32)
Calcium: 8.3 mg/dL — ABNORMAL LOW (ref 8.9–10.3)
Chloride: 109 mmol/L (ref 98–111)
Creatinine, Ser: 1.44 mg/dL — ABNORMAL HIGH (ref 0.44–1.00)
GFR, EST AFRICAN AMERICAN: 37 mL/min — AB (ref >60)
GFR, EST NON AFRICAN AMERICAN: 32 mL/min — AB (ref >60)
Glucose, Bld: 106 mg/dL — ABNORMAL HIGH (ref 70–99)
POTASSIUM: 3.8 mmol/L (ref 3.5–5.1)
SODIUM: 138 mmol/L (ref 135–145)

## 2018-06-14 LAB — AEROBIC CULTURE W GRAM STAIN (SUPERFICIAL SPECIMEN)

## 2018-06-14 MED ORDER — ASPIRIN 81 MG PO TBEC: 81.0000 mg | DELAYED_RELEASE_TABLET | Freq: Every day | ORAL | Status: AC

## 2018-06-14 MED ORDER — ACETAMINOPHEN 325 MG PO TABS: 650.0000 mg | ORAL_TABLET | Freq: Four times a day (QID) | ORAL | Status: AC | PRN

## 2018-06-14 MED ORDER — LEVOTHYROXINE SODIUM 25 MCG PO TABS: 37.5000 ug | ORAL_TABLET | Freq: Every day | ORAL | Status: AC

## 2018-06-14 MED ORDER — POLYMYXIN B-TRIMETHOPRIM 10000-0.1 UNIT/ML-% OP SOLN: 1.0000 [drp] | Freq: Four times a day (QID) | OPHTHALMIC | 0 refills | Status: AC

## 2018-06-14 NOTE — Progress Notes (Signed)
Discharge to: [facility] Anticipated discharge date: 06/14/18 Family notified: Yes, Vlad, by phone Transportation by: [facility]  Report #: [phone], Room 219A  CSW signing off.  Laveda Abbe LCSW [phone]

## 2018-06-14 NOTE — Progress Notes (Signed)
PMT progress note  Patient resting in bed Chart reviewed Appreciate CSW assistance BP (!) 179/70 (BP Location: Right Arm)   Pulse 76   Temp 97.8 F (36.6 C) (Axillary)   Resp 16   Ht 5\' 4"  (1.626 m)   Wt 52.8 kg   SpO2 100%   BMI 19.98 kg/m  Labs and imaging noted In no distress Confused Eye discharge Has blindness Abdomen is not distended Thin frail elderly lady  84 year old lady with a past medical history significant for coronary artery disease status post stent placement over 5 years ago, chronic kidney disease, diabetes, history of CML, hypertension, bilateral eye blindness.  Patient has been living at [facility] facility for the past few months. Patient has been admitted with right-sided weakness and poor oral intake. She was seen by neurology. She was admitted for acute stroke. MRI of the brain revealed multifocal left ACA territory infarcts  Plan: Comfort measures Comfort feeds Hospice to follow at SNF No additional PMT specific recommendations at this time.  [facility] for d/c today.  PPS 30%  15 minutes spent [facility] palliative medicine team [phone]

## 2018-06-14 NOTE — Progress Notes (Signed)
  Speech Language Pathology Treatment: Dysphagia  Patient Details Name: Stacy Hendricks MRN: [id number] DOB: August 10, 1929 Today's Date: 06/14/2018 Time: 3704-8889 SLP Time Calculation (min) (ACUTE ONLY): 20 min  Assessment / Plan / Recommendation Clinical Impression  Pt was seen for dysphagia treatment.  Upon arrival, pt was with RN and nurse tech and appeared to be very agitated at being asked to wear bilateral hand mitts.  Rn reports that pt has been removing lines and leads.  Pt was redirected after several minutes of reassurance that therapist would not put on bilateral hand mitts during her therapy session.  Attempted to put in pt's dentures; however, pt began gagging and dry heaving and requested to remove them.  Pt still expressed a desire to eat even without her dentures in place.  Pt had slightly prolonged but functional mastication of regular textures without her dentures.  No overt s/s of aspiration were evident with solids, liquids, or medications which RN administered during treatment session.  Pt was left in bed with hand mitts left off given that pt was resting calmly at the time of therapist's departure.  RN made aware.  Appreciate palliative care and social work notes regarding goals of care.  Pt is now discharging to SNF this afternoon with hospice services.  As a result, no further ST needs are indicated at this time.    HPI HPI: 84 year old female admitted 06/10/18 with difficulty moving her right side, decline in po intake. PMH: CAD, CKD, IDDM, CML, HTN, bilateral blindness. MRI = acute multifocal L ACA infarcts, old L MCA, R temporal, L cerebellar infarcts.      SLP Plan  Continue with current plan of care       Recommendations  Diet recommendations: Regular;Thin liquid Liquids provided via: Straw Medication Administration: Whole meds with liquid Supervision: Staff to assist with self feeding Compensations: Minimize environmental distractions;Slow rate;Small  sips/bites Postural Changes and/or Swallow Maneuvers: Seated upright 90 degrees;Upright 30-60 min after meal                Oral Care Recommendations: Oral care BID Follow up Recommendations: Other (comment)(discharge to SNF with hospice service) SLP Visit Diagnosis: Dysphagia, unspecified (R13.10) Plan: Continue with current plan of care       GO                Dalen [place], Selinda Orion 06/14/2018, 11:58 AM

## 2018-06-14 NOTE — Discharge Summary (Signed)
Physician Discharge Summary  Stacy Hendricks ZOX:[id number] DOB: Jan 29, 1929 DOA: 06/10/2018  PCP: Crist Infante, MD  Admit date: 06/10/2018 Discharge date: 06/14/2018  Admitted From: SNF Disposition: SNF with hospice services to follow as outpatient   Recommendations for Outpatient Follow-up:  1. Follow up with PCP in 1-2 weeks 2. Please obtain BMP/CBC in one week 3. Continue to monitor right 3rd finger laceration 4. Follow up with neurology in 6 weeks. Continue ASA, plavix for 3 months, then ASA alone.  5. Recheck TSH in 6 weeks and adjust synthroid as needed (dose decreased due to suppressed TSH)  Home Health: N/A Equipment/Devices: Per SNF Discharge Condition: Stable CODE STATUS: DNR Diet recommendation: As tolerated for comfort  Brief/Interim Summary: Stacy Hendricks is an 84 year old woman with history of CAD s/p PCI, stage IV CKD, IDDM, CML on TKI therapy, HTN, and glaucoma with bilateral blindness who presented to the ED with right-sided weakness as well as subacute poor per oral intake having recently moved to [place] with her husband who is 33 with advanced dementia. MRI of the brain revealed multifocal left ACA territory infarcts for which neurology was consulted, recommending DAPT for 3 months, then aspirin alone. She was hypokalemic with prolonged QT interval, both of which have resolved with treatment. Palliative care was consulted and continued goals of care discussions with the patient and her son. She is DNR with goal to focus on comfort, with plans to be followed by hospice at [facility] at discharge.  Discharge Diagnoses:  Active Problems:   CVA (cerebral vascular accident) ([place])  Acute multifocal left ACA territory infarcts: Also noted older infarcts (L MCA, R temporal/MCA, L cerebellum). No CES on echo. Severe intracranial stenosis on MRA (L A2 ELVO. L M1 occlusion (chronic). Occluded basilar tp. tenusous PCA filling. L VA slow flow vs occlusion. Severe IC  atherosclerosis). - ASA, plavix x3 months, then aspirin alone as recommended by neurology - Follow up with neurology in 6 weeks - Continue high intensity statin, LDL 43.  - Moderate aspiration risk. Based on goals of care discussions, will allow comfort feeds regardless and not pursue artifical means of feeding. - Continue SNF at discharge with hospice services following.  Acute metabolic encephalopathy: Multifactorial, improving. Due to underlying CVD and anterior acute CVAs, depression, possibly delirium as well. Suspect an element of vascular dementia due to family's report of preexisting cognitive impairment. - Monitor closely without targeted treatment  Right 3rd finger laceration: Shallow, copiously irrigated with no indication for primary closure. Hemostatic at time of discharge.  - Recommend continuing topical bandage until healed.   Hypothyroidism: With low TSH at 0.254.  - Decreased synthroid dose 80mcg > 37.99mcg. Please monitor TFTs and clinical response.  IDDM: Last HbA1c 6.7% indicating overly tight control for her age.  - DC long acting insulin with continuously low CBGs. Can continue 50/50 humalog though may only require tradjenta.  Stage IV CKD: Cr at/near baseline.  - Renally dose medications - Avoid nephrotoxins  CAD, HTN:  - Plavix, statin, BB. Restart CCB.  CML:  - Follow up with oncology as outpatient, continue TKI  Hypokalemia:  - Monitor and replace as indicated.   QT prolongation: Resolved 9/17.  Glaucoma with blindness:  - Home gtt's  Depression, adjustment disorder, poor per oral intake:  - Continue SSRI, remeron - Ensure BIDBM, consult dietitian  Left eye conjunctivitis: Culture may or may not be helpful, showing S. aureus. Clinically resolving. - Trimethoprim-Polymixin  x7 days (complete 9/24).  Discharge Instructions Discharge Instructions  Ambulatory referral to Neurology   Complete by:  As directed    Follow up with stroke  clinic NP (Jessica Vanschaick or Cecille Rubin, if both not available, consider Dr. Antony Contras, Dr. Bess Harvest, or Dr. Sarina Ill) at [facility] in about 4 weeks.   Diet general   Complete by:  As directed    Increase activity slowly   Complete by:  As directed      Allergies as of 06/14/2018   No Known Allergies     Medication List    STOP taking these medications   insulin glargine 100 UNIT/ML injection Commonly known as:  LANTUS     TAKE these medications   acetaminophen 325 MG tablet Commonly known as:  TYLENOL Take 2 tablets (650 mg total) by mouth every 6 (six) hours as needed for mild pain.   amLODipine 10 MG tablet Commonly known as:  NORVASC Take 10 mg by mouth daily.   aspirin 81 MG EC tablet Take 1 tablet (81 mg total) by mouth daily. Start taking on:  06/15/2018   carvedilol 6.25 MG tablet Commonly known as:  COREG Take 6.25 mg by mouth 2 (two) times daily with a meal.   clopidogrel 75 MG tablet Commonly known as:  PLAVIX Take 75 mg by mouth daily.   COMBIGAN 0.2-0.5 % ophthalmic solution Generic drug:  brimonidine-timolol Place 1 drop into both eyes every 12 (twelve) hours.   docusate sodium 100 MG capsule Commonly known as:  COLACE Take 100 mg by mouth daily. Give 1 capsule by mouth daily   ENSURE Give 120 ml two times daily between meals   escitalopram 20 MG tablet Commonly known as:  LEXAPRO Take 20 mg by mouth daily.   HUMALOG MIX 50/50 KWIKPEN (50-50) 100 UNIT/ML Kwikpen Generic drug:  Insulin Lispro Prot & Lispro Inject 20 Units into the skin daily. Hold for cbg <100   levothyroxine 25 MCG tablet Commonly known as:  SYNTHROID, LEVOTHROID Take 1.5 tablets (37.5 mcg total) by mouth daily before breakfast. What changed:    medication strength  how much to take   linagliptin 5 MG Tabs tablet Commonly known as:  TRADJENTA Take 5 mg by mouth daily.   LUMIGAN 0.01 % Soln Generic drug:  bimatoprost Place 1  drop into both eyes at bedtime.   mirtazapine 7.5 MG tablet Commonly known as:  REMERON Take 7.5 mg by mouth at bedtime.   nilotinib 200 MG capsule Commonly known as:  TASIGNA Take 2 capsules (400 mg total) by mouth every 12 (twelve) hours. Give on an empty stomach 1 hour before or 2 hours after meals.   polyethylene glycol packet Commonly known as:  MIRALAX / GLYCOLAX Take 17 g by mouth daily. And as needed   rosuvastatin 10 MG tablet Commonly known as:  CRESTOR Take 10 mg by mouth daily.   trimethoprim-polymyxin b ophthalmic solution Commonly known as:  POLYTRIM Place 1 drop into the left eye every 6 (six) hours for 5 days.       Contact information for follow-up providers    [facility] Follow up in 4 week(s).   Specialty:  Neurology Why:  stroke clinic. office will call with appt date and time Contact information: [address] [place] [phone]       Crist Infante, MD. Schedule an appointment as soon as possible for a visit.   Specialty:  Internal Medicine Contact information: [address] [place] [address]  [phone]            Contact information for after-discharge care    Destination    [facility] .   Service:  Skilled Nursing Contact information: [address] [place] [phone]                 No Known Allergies  Consultations:  Neurology  Palliative care team  Procedures/Studies: Mr Brain Wo Contrast  Result Date: 06/11/2018 CLINICAL DATA:  Worsening confusion, RIGHT-sided weakness. History of stroke, blindness, hypertension and hyperlipidemia. EXAM: MRI HEAD WITHOUT CONTRAST MRA HEAD WITHOUT CONTRAST TECHNIQUE: Multiplanar, multiecho pulse sequences of the brain and surrounding structures were obtained without intravenous contrast. Angiographic images of the head were obtained using MRA technique without  contrast. COMPARISON:  CT HEAD June 10, 2018 FINDINGS: MRI HEAD FINDINGS-moderately motion degraded examination. INTRACRANIAL CONTENTS: Patchy reduced diffusion mesial LEFT frontal parietal lobes including splenium of the corpus callosum with low ADC values. Susceptibility artifact LEFT lateral ventricle versus thalamus. A few scattered chronic microhemorrhages. LEFT frontal parietoccipital lobe encephalomalacia with ex vacuo dilatation LEFT occipital horn. Moderate to severe parenchymal brain volume loss. Old small LEFT cerebellar infarcts. Small area of RIGHT temporal lobe encephalomalacia. Prominent basal ganglia perivascular spaces associated chronic small vessel ischemic changes. Patchy supratentorial white matter FLAIR T2 hyperintensities. No midline shift, mass effect or masses. No abnormal extra-axial fluid collections. VASCULAR: Normal major intracranial vascular flow voids present at skull base. SKULL AND UPPER CERVICAL SPINE: No abnormal sellar expansion. No suspicious calvarial bone marrow signal. Craniocervical junction maintained. SINUSES/ORBITS: The mastoid air-cells and included paranasal sinuses are well-aerated.The included ocular globes and orbital contents are non-suspicious. OTHER: Patient is edentulous. MRA HEAD FINDINGS ANTERIOR CIRCULATION: Normal flow related enhancement of the included cervical, petrous, cavernous and supraclinoid internal carotid arteries. Patent anterior communicating artery. LEFT A2 occlusion with reconstitution within 1 cm. Occluded LEFT M1 segments with reconstitution. Tandem severe stenosis anterior middle cerebral arteries. No aneurysm. POSTERIOR CIRCULATION: No LEFT vertebral artery flow related enhancement. Patent RIGHT vertebral artery and basilar artery. Severe stenosis bilateral SCA. Occluded distal basilar artery. Thready flow related enhancement LEFT posterior cerebral artery from LEFT PCOM. Patent RIGHT posterior cerebral artery via posterior  communicating artery. Tandem severe stenosis posterior cerebral arteries. No aneurysm. ANATOMIC VARIANTS: None. Source images and MIP images were reviewed. IMPRESSION: MRI HEAD: 1. Moderately motion degraded examination. Acute multifocal LEFT ACA territory nonhemorrhagic infarcts. 2. Old LEFT MCA/posterior border zone territory infarcts. Old RIGHT temporal/MCA territory infarct. Old small LEFT cerebellar infarct. 3. Moderate chronic small vessel ischemic changes. 4. Moderate to severe parenchymal brain volume loss. MRA HEAD: 1. Emergent LEFT A2 occlusion with reconstitution. 2. LEFT M1 occlusion with reconstitution, likely chronic. 3. Occluded basilar tip, tenuous PCA filling from posterior communicating arteries. 4. Slow flow versus occluded LEFT vertebral artery. 5. Severe intracranial atherosclerosis. These results will be called to the ordering clinician or representative by the Radiologist Assistant, and communication documented in the PACS or zVision Dashboard. Electronically Signed   By: Elon Alas M.D.   On: 06/11/2018 03:18   Dg Chest Portable 1 View  Result Date: 06/10/2018 CLINICAL DATA:  Cough. EXAM: PORTABLE CHEST 1 VIEW COMPARISON:  None. FINDINGS: Patient is rotated to the left. Lungs are adequately inflated without consolidation or effusion. Mild increased density over the midline neck base at the level of the sternal notch likely due to goiter. Mild cardiomegaly. Degenerative change of the spine. IMPRESSION: No acute cardiopulmonary disease. Mild  cardiomegaly. Electronically Signed   By: Marin Olp M.D.   On: 06/10/2018 20:02   Mr Jodene Nam Head Wo Contrast  Result Date: 06/11/2018 CLINICAL DATA:  Worsening confusion, RIGHT-sided weakness. History of stroke, blindness, hypertension and hyperlipidemia. EXAM: MRI HEAD WITHOUT CONTRAST MRA HEAD WITHOUT CONTRAST TECHNIQUE: Multiplanar, multiecho pulse sequences of the brain and surrounding structures were obtained without intravenous  contrast. Angiographic images of the head were obtained using MRA technique without contrast. COMPARISON:  CT HEAD June 10, 2018 FINDINGS: MRI HEAD FINDINGS-moderately motion degraded examination. INTRACRANIAL CONTENTS: Patchy reduced diffusion mesial LEFT frontal parietal lobes including splenium of the corpus callosum with low ADC values. Susceptibility artifact LEFT lateral ventricle versus thalamus. A few scattered chronic microhemorrhages. LEFT frontal parietoccipital lobe encephalomalacia with ex vacuo dilatation LEFT occipital horn. Moderate to severe parenchymal brain volume loss. Old small LEFT cerebellar infarcts. Small area of RIGHT temporal lobe encephalomalacia. Prominent basal ganglia perivascular spaces associated chronic small vessel ischemic changes. Patchy supratentorial white matter FLAIR T2 hyperintensities. No midline shift, mass effect or masses. No abnormal extra-axial fluid collections. VASCULAR: Normal major intracranial vascular flow voids present at skull base. SKULL AND UPPER CERVICAL SPINE: No abnormal sellar expansion. No suspicious calvarial bone marrow signal. Craniocervical junction maintained. SINUSES/ORBITS: The mastoid air-cells and included paranasal sinuses are well-aerated.The included ocular globes and orbital contents are non-suspicious. OTHER: Patient is edentulous. MRA HEAD FINDINGS ANTERIOR CIRCULATION: Normal flow related enhancement of the included cervical, petrous, cavernous and supraclinoid internal carotid arteries. Patent anterior communicating artery. LEFT A2 occlusion with reconstitution within 1 cm. Occluded LEFT M1 segments with reconstitution. Tandem severe stenosis anterior middle cerebral arteries. No aneurysm. POSTERIOR CIRCULATION: No LEFT vertebral artery flow related enhancement. Patent RIGHT vertebral artery and basilar artery. Severe stenosis bilateral SCA. Occluded distal basilar artery. Thready flow related enhancement LEFT posterior cerebral  artery from LEFT PCOM. Patent RIGHT posterior cerebral artery via posterior communicating artery. Tandem severe stenosis posterior cerebral arteries. No aneurysm. ANATOMIC VARIANTS: None. Source images and MIP images were reviewed. IMPRESSION: MRI HEAD: 1. Moderately motion degraded examination. Acute multifocal LEFT ACA territory nonhemorrhagic infarcts. 2. Old LEFT MCA/posterior border zone territory infarcts. Old RIGHT temporal/MCA territory infarct. Old small LEFT cerebellar infarct. 3. Moderate chronic small vessel ischemic changes. 4. Moderate to severe parenchymal brain volume loss. MRA HEAD: 1. Emergent LEFT A2 occlusion with reconstitution. 2. LEFT M1 occlusion with reconstitution, likely chronic. 3. Occluded basilar tip, tenuous PCA filling from posterior communicating arteries. 4. Slow flow versus occluded LEFT vertebral artery. 5. Severe intracranial atherosclerosis. These results will be called to the ordering clinician or representative by the Radiologist Assistant, and communication documented in the PACS or zVision Dashboard. Electronically Signed   By: Elon Alas M.D.   On: 06/11/2018 03:18   Ct Head Code Stroke Wo Contrast  Result Date: 06/10/2018 CLINICAL DATA:  Code stroke.  Generalized weakness and dysesthesias. EXAM: CT HEAD WITHOUT CONTRAST TECHNIQUE: Contiguous axial images were obtained from the base of the skull through the vertex without intravenous contrast. COMPARISON:  None. FINDINGS: Brain: The brainstem shows chronic small-vessel change of the pons. No focal cerebellar insult. Cerebral hemispheres show generalized atrophy. There chronic small-vessel ischemic changes of the white matter. No large vessel stroke seen in the right hemisphere. On the left, there is old cortical and subcortical infarction in the posterior frontal and the parietal region. No identifiable acute infarction. No mass lesion, hemorrhage, hydrocephalus or extra-axial collection. Vascular: There is  atherosclerotic calcification of the major vessels at the  base of the brain. Skull: Negative Sinuses/Orbits: Clear/normal Other: None ASPECTS: Unable to apply in the setting of old infarctions on the left and without lateralizing findings in the history. IMPRESSION: 1. No acute finding by CT. Atrophy. Old cortical and subcortical infarctions in the left frontal and left parietal regions. Chronic small-vessel ischemic changes elsewhere. 2. ASPECTS is not applicable given the absence of lateralizing signs and the extensive chronic changes. 3. These results were communicated to Dr. Rory Percy At 7:13 pmon 9/15/2019by text page via the [facility] messaging system. Electronically Signed   By: Nelson Chimes M.D.   On: 06/10/2018 19:14     Echocardiogram 06/11/2018: - Left ventricle: The cavity size was normal. There was mild concentric hypertrophy. Systolic function was normal. The estimated ejection fraction was in the range of 55% to 60%. Wall motion was normal; there were no regional wall motion abnormalities. Doppler parameters are consistent with abnormal left ventricular relaxation (grade 1 diastolic dysfunction). - Aortic valve: Trileaflet; moderately thickened, moderately calcified leaflets. There was trivial regurgitation. - Mitral valve: Calcified annulus. - Left atrium: The atrium was mildly dilated. Volume/bsa, S: 40.9 ml/m^2.  Impressions: - No cardiac source of emboli was indentified  Subjective: Cut her right 3rd finger but unsure of how, noticed in AM rounds. Suspect patient cut it with fingernails while removing safety mitten. Denies numbness or weakness of the finger. Some slow bleeding stopped with pressure bandage after 10 minutes.   Discharge Exam: Vitals:   06/14/18 0346 06/14/18 0748  BP: (!) 183/66 (!) 179/70  Pulse: 72 76  Resp: 14 16  Temp: 97.7 F (36.5 C) 97.8 F (36.6 C)  SpO2: 100% 100%   General: Pt is alert, awake, not in acute distress Cardiovascular:  RRR, S1/S2 +, no rubs, no gallops Respiratory: CTA bilaterally, no wheezing, no rhonchi Abdominal: Soft, NT, ND, bowel sounds + Extremities: Right 3rd finger with ~0.5cm transverse superficial laceration overlying palmar DIP joint. Irrigated with sterile saline with no foreign bodies. Has full ROM and intact sensation distally. Cap refill ~2 seconds. Long fingernails.  Neuro: Alert, not oriented. Right arm/leg 3/5 strength.   Labs: BNP (last 3 results) No results for input(s): BNP in the last 8760 hours. Basic Metabolic Panel: Recent Labs  Lab 06/10/18 1850 06/10/18 1854 06/11/18 0506 06/12/18 1055 06/13/18 0247 06/14/18 0241  NA 139 140 141 139 139 138  K 3.7 3.7 3.2* 3.6 3.6 3.8  CL 110 107 109 111 110 109  CO2 22  --  24 19* 21* 24  GLUCOSE 243* 234* 151* 78 67* 106*  BUN 15 17 12 9 9 9   CREATININE 2.03* 2.20* 1.73* 1.48* 1.47* 1.44*  CALCIUM 8.2*  --  8.1* 8.3* 8.2* 8.3*  MG  --   --   --  2.0 2.0  --   PHOS  --   --   --  2.4* 2.8  --    Liver Function Tests: Recent Labs  Lab 06/10/18 1850 06/11/18 0506 06/12/18 1055 06/13/18 0247  AST 23 21  --   --   ALT 22 20  --   --   ALKPHOS 156* 140*  --   --   BILITOT 1.1 0.9  --   --   PROT 6.3* 5.6*  --   --   ALBUMIN 3.0* 2.7* 3.1* 2.6*   No results for input(s): LIPASE, AMYLASE in the last 168 hours. Recent Labs  Lab 06/11/18 0506  AMMONIA 17   CBC: Recent Labs  Lab  06/10/18 1850 06/10/18 1854 06/11/18 0506 06/13/18 0247  WBC 5.5  --  4.2 5.3  NEUTROABS 3.9  --   --  3.5  HGB 10.0* 10.2* 9.1* 9.2*  HCT 31.6* 30.0* 28.0* 28.1*  MCV 91.6  --  88.9 89.5  PLT 141*  --  144* 120*   Cardiac Enzymes: No results for input(s): CKTOTAL, CKMB, CKMBINDEX, TROPONINI in the last 168 hours. BNP: Invalid input(s): POCBNP CBG: Recent Labs  Lab 06/13/18 0652 06/13/18 1120 06/13/18 1606 06/13/18 1802 06/13/18 2104  GLUCAP 116* 77 53* 180* 137*   D-Dimer No results for input(s): DDIMER in the last 72  hours. Hgb A1c No results for input(s): HGBA1C in the last 72 hours. Lipid Profile No results for input(s): CHOL, HDL, LDLCALC, TRIG, CHOLHDL, LDLDIRECT in the last 72 hours. Thyroid function studies No results for input(s): TSH, T4TOTAL, T3FREE, THYROIDAB in the last 72 hours.  Invalid input(s): FREET3 Anemia work up No results for input(s): VITAMINB12, FOLATE, FERRITIN, TIBC, IRON, RETICCTPCT in the last 72 hours. Urinalysis    Component Value Date/Time   COLORURINE AMBER (A) 06/10/2018 1923   APPEARANCEUR HAZY (A) 06/10/2018 1923   LABSPEC 1.018 06/10/2018 1923   PHURINE 5.0 06/10/2018 1923   GLUCOSEU 150 (A) 06/10/2018 [place] NEGATIVE 06/10/2018 [place] NEGATIVE 06/10/2018 1923   KETONESUR 5 (A) 06/10/2018 1923   PROTEINUR 30 (A) 06/10/2018 1923   NITRITE NEGATIVE 06/10/2018 1923   LEUKOCYTESUR NEGATIVE 06/10/2018 1923    Microbiology Recent Results (from the past 240 hour(s))  MRSA PCR Screening     Status: None   Collection Time: 06/10/18 10:50 PM  Result Value Ref Range Status   MRSA by PCR NEGATIVE NEGATIVE Final    Comment:        The GeneXpert MRSA Assay (FDA approved for NASAL specimens only), is one component of a comprehensive MRSA colonization surveillance program. It is not intended to diagnose MRSA infection nor to guide or monitor treatment for MRSA infections. Performed at [facility], [place] [address], [place], [place] [address]   Aerobic Culture (superficial specimen)     Status: None   Collection Time: 06/11/18 11:25 AM  Result Value Ref Range Status   Specimen Description EYE  Final   Special Requests NONE  Final   Gram Stain   Final    RARE WBC PRESENT,BOTH PMN AND MONONUCLEAR FEW GRAM POSITIVE RODS RARE GRAM POSITIVE COCCI IN PAIRS    Culture   Final    FEW STAPHYLOCOCCUS AUREUS MODERATE DIPHTHEROIDS(CORYNEBACTERIUM SPECIES) Standardized susceptibility testing for this organism is not available. Performed at  [facility], [place] [address], [place], [place] [address]    Report Status 06/14/2018 FINAL  Final   Organism ID, Bacteria STAPHYLOCOCCUS AUREUS  Final      Susceptibility   Staphylococcus aureus - MIC*    CIPROFLOXACIN <=0.5 SENSITIVE Sensitive     ERYTHROMYCIN >=8 RESISTANT Resistant     GENTAMICIN <=0.5 SENSITIVE Sensitive     OXACILLIN 0.5 SENSITIVE Sensitive     TETRACYCLINE <=1 SENSITIVE Sensitive     VANCOMYCIN <=0.5 SENSITIVE Sensitive     TRIMETH/SULFA <=10 SENSITIVE Sensitive     CLINDAMYCIN >=8 RESISTANT Resistant     RIFAMPIN <=0.5 SENSITIVE Sensitive     Inducible Clindamycin NEGATIVE Sensitive     * FEW STAPHYLOCOCCUS AUREUS    Time coordinating discharge: Approximately 40 minutes  Patrecia Pour, MD  [facility] 06/14/2018, 1:00 PM  Pager [phone]

## 2018-06-14 NOTE — Progress Notes (Signed)
Report called and patient discharged to [facility] in stable condition with all belongings.

## 2018-06-14 NOTE — Clinical Social Work Note (Signed)
Clinical Social Work Assessment  Patient Details  Name: Stacy Hendricks MRN: [id number] Date of Birth: Apr 12, 1929  Date of referral:  06/14/18               Reason for consult:  Facility Placement                Permission sought to share information with:  Facility Sport and exercise psychologist, Family Supports Permission granted to share information::  Yes, Verbal Permission Granted  Name::     Biochemist, clinical::  [employer]  Relationship::  Son  Sport and exercise psychologist Information:     Housing/Transportation Living arrangements for the past 2 months:  [place] of Information:  Medical Team, Adult Children Patient Interpreter Needed:  None Criminal Activity/Legal Involvement Pertinent to Current Situation/Hospitalization:  No - Comment as needed Significant Relationships:  Adult Children, Spouse Lives with:  Self, Spouse, Facility Resident Do you feel safe going back to the place where you live?  Yes Need for family participation in patient care:  Yes (Comment)  Care giving concerns:  Patient from [place] and family has no concerns about care received.   Social Worker assessment / plan:  CSW alerted by medical team that patient is ready to return back to SNF, and that son would like for patient to have hospice services set up upon return. CSW contacted patient's son to verify, and let him know that patient will be returning this afternoon. CSW alerted admissions at [facility], and they are ready for patient to return.  Employment status:  Retired Forensic scientist:  Medicare PT Recommendations:  Not assessed at this time Information / Referral to community resources:  [place]  Patient/Family's Response to care:  Patient's son agreeable to SNF placement.  Patient/Family's Understanding of and Emotional Response to Diagnosis, Current Treatment, and Prognosis:  Patient's son acknowledged that patient's qualify of life has not been good lately and  he is at peace with making the patient comfortable for her last days. Patient's son appreciative of care received at the hospital and glad that patient can return to her SNF.  Emotional Assessment Appearance:  Appears stated age Attitude/Demeanor/Rapport:  Unable to Assess Affect (typically observed):  Unable to Assess Orientation:  Oriented to Self Alcohol / Substance use:  Not Applicable Psych involvement (Current and /or in the community):  No (Comment)  Discharge Needs  Concerns to be addressed:  Care Coordination Readmission within the last 30 days:  No Current discharge risk:  Terminally ill, Dependent with Mobility Barriers to Discharge:  No Barriers Identified   Geralynn Ochs, LCSW 06/14/2018, 11:06 AM

## 2018-06-14 NOTE — Progress Notes (Signed)
Patient pulled off safety mittens and sliced her middle right finger with her nail, took out iv and torn off telemetry monitor. Dr. Ree Kida and at bedside, finger cleaned and wrapped. Orders to d/c tele and iv as she will be leaving soon.

## 2018-06-15 ENCOUNTER — Non-Acute Institutional Stay (SKILLED_NURSING_FACILITY): Payer: Medicare Other | Admitting: Adult Health

## 2018-06-15 DIAGNOSIS — D630 Anemia in neoplastic disease: Secondary | ICD-10-CM | POA: Diagnosis not present

## 2018-06-15 DIAGNOSIS — C9210 Chronic myeloid leukemia, BCR/ABL-positive, not having achieved remission: Secondary | ICD-10-CM

## 2018-06-15 DIAGNOSIS — R293 Abnormal posture: Secondary | ICD-10-CM | POA: Diagnosis not present

## 2018-06-15 DIAGNOSIS — N184 Chronic kidney disease, stage 4 (severe): Secondary | ICD-10-CM

## 2018-06-15 DIAGNOSIS — E1139 Type 2 diabetes mellitus with other diabetic ophthalmic complication: Secondary | ICD-10-CM

## 2018-06-15 DIAGNOSIS — E034 Atrophy of thyroid (acquired): Secondary | ICD-10-CM

## 2018-06-15 DIAGNOSIS — H42 Glaucoma in diseases classified elsewhere: Secondary | ICD-10-CM

## 2018-06-15 DIAGNOSIS — E1169 Type 2 diabetes mellitus with other specified complication: Secondary | ICD-10-CM | POA: Diagnosis not present

## 2018-06-15 DIAGNOSIS — I63322 Cerebral infarction due to thrombosis of left anterior cerebral artery: Secondary | ICD-10-CM

## 2018-06-15 DIAGNOSIS — Z794 Long term (current) use of insulin: Secondary | ICD-10-CM

## 2018-06-15 DIAGNOSIS — R1311 Dysphagia, oral phase: Secondary | ICD-10-CM | POA: Diagnosis not present

## 2018-06-15 DIAGNOSIS — R627 Adult failure to thrive: Secondary | ICD-10-CM | POA: Diagnosis not present

## 2018-06-15 DIAGNOSIS — E119 Type 2 diabetes mellitus without complications: Secondary | ICD-10-CM | POA: Diagnosis not present

## 2018-06-15 DIAGNOSIS — I6322 Cerebral infarction due to unspecified occlusion or stenosis of basilar arteries: Secondary | ICD-10-CM | POA: Diagnosis not present

## 2018-06-15 DIAGNOSIS — E785 Hyperlipidemia, unspecified: Secondary | ICD-10-CM | POA: Diagnosis not present

## 2018-06-15 DIAGNOSIS — M6281 Muscle weakness (generalized): Secondary | ICD-10-CM | POA: Diagnosis not present

## 2018-06-15 DIAGNOSIS — I129 Hypertensive chronic kidney disease with stage 1 through stage 4 chronic kidney disease, or unspecified chronic kidney disease: Secondary | ICD-10-CM | POA: Diagnosis not present

## 2018-06-15 DIAGNOSIS — Z741 Need for assistance with personal care: Secondary | ICD-10-CM | POA: Diagnosis not present

## 2018-06-15 DIAGNOSIS — E1122 Type 2 diabetes mellitus with diabetic chronic kidney disease: Secondary | ICD-10-CM

## 2018-06-15 NOTE — Progress Notes (Signed)
Location:   [facility] Room Number: 767 Place of Service:  SNF (31)   CODE STATUS: dnr  No Known Allergies  Chief Complaint  Patient presents with  . Hospitalization Follow-up    HPI:  She is a 84 year old long term resident of this facility who has been hospitalized from 06-10-18 through 06-14-18 for an acute multifocal cva. She will need to be on plavix for 3 months and will then be on asa only after that time. She is unable to participate in the hpi or ros. Her appetite continues to be poor. There are no reports of uncontrolled pain or anxiety. There are no nursing concerns at this time. She will continue to be followed for her chronic illnesses including: dyslipidemia; diabetes; hypertension.    Past Medical History:  Diagnosis Date  . Anemia   . Chronic kidney disease   . CML (chronic myeloid leukemia) ([place])   . Depression   . Diabetes mellitus without complication ([place])   . Hyperlipidemia   . Hypertension   . Thyroid disease     Past Surgical History:  Procedure Laterality Date  . APPENDECTOMY    . PTCA      Social History   Socioeconomic History  . Marital status: Married    Spouse name: Not on file  . Number of children: Not on file  . Years of education: Not on file  . Highest education level: Not on file  Occupational History  . Not on file  Social Needs  . Financial resource strain: Not on file  . Food insecurity:    Worry: Not on file    Inability: Not on file  . Transportation needs:    Medical: Not on file    Non-medical: Not on file  Tobacco Use  . Smoking status: Never Smoker  . Smokeless tobacco: Never Used  Substance and Sexual Activity  . Alcohol use: Never    Frequency: Never  . Drug use: Never  . Sexual activity: Not on file  Lifestyle  . Physical activity:    Days per week: Not on file    Minutes per session: Not on file  . Stress: Not on file  Relationships  . Social connections:    Talks on phone: Not on  file    Gets together: Not on file    Attends religious service: Not on file    Active member of club or organization: Not on file    Attends meetings of clubs or organizations: Not on file    Relationship status: Not on file  . Intimate partner violence:    Fear of current or ex partner: Not on file    Emotionally abused: Not on file    Physically abused: Not on file    Forced sexual activity: Not on file  Other Topics Concern  . Not on file  Social History Narrative  . Not on file   Family History  Problem Relation Age of Onset  . Heart attack Father       VITAL SIGNS BP (!) 155/90   Pulse 94   Temp (!) 97.2 F (36.2 C)   Resp 18   Ht 5\' 4"  (1.626 m)   Wt 119 lb (54 kg)   SpO2 93%   BMI 20.43 kg/m   Outpatient Encounter Medications as of 06/15/2018  Medication Sig  . acetaminophen (TYLENOL) 325 MG tablet Take 2 tablets (650 mg total) by mouth every 6 (six) hours  as needed for mild pain.  Marland Kitchen amLODipine (NORVASC) 10 MG tablet Take 10 mg by mouth daily.   Marland Kitchen aspirin EC 81 MG EC tablet Take 1 tablet (81 mg total) by mouth daily.  . carvedilol (COREG) 6.25 MG tablet Take 6.25 mg by mouth 2 (two) times daily with a meal.   . clopidogrel (PLAVIX) 75 MG tablet Take 75 mg by mouth daily.   . COMBIGAN 0.2-0.5 % ophthalmic solution Place 1 drop into both eyes every 12 (twelve) hours.   . docusate sodium (COLACE) 100 MG capsule Take 100 mg by mouth daily. Give 1 capsule by mouth daily   . ENSURE (ENSURE) Give 120 ml two times daily between meals  . escitalopram (LEXAPRO) 20 MG tablet Take 20 mg by mouth daily.   Marland Kitchen HUMALOG MIX 50/50 KWIKPEN (50-50) 100 UNIT/ML Kwikpen Inject 20 Units into the skin daily. Hold for cbg <100  . levothyroxine (SYNTHROID, LEVOTHROID) 25 MCG tablet Take 1.5 tablets (37.5 mcg total) by mouth daily before breakfast.  . linagliptin (TRADJENTA) 5 MG TABS tablet Take 5 mg by mouth daily.  Marland Kitchen LUMIGAN 0.01 % SOLN Place 1 drop into both eyes at bedtime.   .  mirtazapine (REMERON) 7.5 MG tablet Take 7.5 mg by mouth at bedtime.  . nilotinib (TASIGNA) 200 MG capsule Take 2 capsules (400 mg total) by mouth every 12 (twelve) hours. Give on an empty stomach 1 hour before or 2 hours after meals.  . polyethylene glycol (MIRALAX / GLYCOLAX) packet Take 17 g by mouth daily. And as needed  . rosuvastatin (CRESTOR) 10 MG tablet Take 10 mg by mouth daily.   Marland Kitchen trimethoprim-polymyxin b (POLYTRIM) ophthalmic solution Place 1 drop into the left eye every 6 (six) hours for 5 days.   No facility-administered encounter medications on file as of 06/15/2018.      SIGNIFICANT DIAGNOSTIC EXAMS  TODAY:   06-10-18: chest x-ray: No acute cardiopulmonary disease. Mild cardiomegaly.  06-10-18: ct of head: 1. No acute finding by CT. Atrophy. Old cortical and subcortical infarctions in the left frontal and left parietal regions. Chronic small-vessel ischemic changes elsewhere. 2. ASPECTS is not applicable given the absence of lateralizing signs and the extensive chronic changes.  06-11-18: 2-d echo:  - Left ventricle: The cavity size was normal. There was mild concentric hypertrophy. Systolic function was normal. The estimated ejection fraction was in the range of 55% to 60%. Wall motion was normal; there were no regional wall motion abnormalities. Doppler parameters are consistent with abnormal left ventricular relaxation (grade 1 diastolic dysfunction). - Aortic valve: Trileaflet; moderately thickened, moderately calcified leaflets. There was trivial regurgitation. - Mitral valve: Calcified annulus. - Left atrium: The atrium was mildly dilated  06-11-18: MRI/MRA head:  MRI HEAD: 1. Moderately motion degraded examination. Acute multifocal LEFT ACA territory nonhemorrhagic infarcts. 2. Old LEFT MCA/posterior border zone territory infarcts. Old RIGHT temporal/MCA territory infarct. Old small LEFT cerebellar infarct. 3. Moderate chronic small vessel ischemic changes. 4.  Moderate to severe parenchymal brain volume loss.  MRA HEAD: 1. Emergent LEFT A2 occlusion with reconstitution. 2. LEFT M1 occlusion with reconstitution, likely chronic. 3. Occluded basilar tip, tenuous PCA filling from posterior communicating arteries. 4. Slow flow versus occluded LEFT vertebral artery. 5. Severe intracranial atherosclerosis.    LABS REVIEWED: PREVIOUS:   05-02-18: wbc 9,3; hgb 10.7; hct 32.5; mcv 85.8; plt 175; glucose 201; bun 23.1; creat 1.64; k+ 3.8; na++ 143; ca 8.9; liver normal albumin 4.2; chol 172; ldl 96; trig 138;  hdl 49; hgb a1c 6.7  TODAY:   06-10-18: wbc  5.5; hgb 10.0; ht 31.6; mcv 91.6; plt 141; glucose 243; bun 15; creat 2.03; k+ 3.7; na++ 139; ca 8.2; liver normal albumin 3.0 06-11-18: hgb a1c  7.8 chol 108; ldl 43; trig 139; trig 37 tsh 0.254  Vit B 12: 576 iron 53; tibc 221; ferritin 115 06-13-18: wbc 5.3; hgb 9.2; hct 281. mcv 89.5; plt 120 glucose 67; bun 9; creat 1.47; k+ 3.6; na++ 139; ca 8.2; phos 2.8; albumin 2.6 mag 2.0 06-14-18: glucose 106; bun 9; creat 1.44; k+ 3.8; na++138; ca 8.3     Review of Systems  Unable to perform ROS: Dementia (unable to participate )    Physical Exam  Constitutional: No distress.  Frail   Eyes:  Blind both eyes   Neck: No thyromegaly present.  Cardiovascular: Normal rate, regular rhythm and intact distal pulses.  Murmur heard. 3/6  Pulmonary/Chest: Effort normal and breath sounds normal. No respiratory distress.  Abdominal: Soft. Bowel sounds are normal. She exhibits no distension. There is no tenderness.  Musculoskeletal: She exhibits no edema.  Is able to move all extremities   Lymphadenopathy:    She has no cervical adenopathy.  Neurological:  Aware   Skin: Skin is warm and dry. She is not diaphoretic.  Psychiatric: She has a normal mood and affect.     ASSESSMENT/ PLAN:  TODAY ;  1. Anemia in neoplastic disease: is stable hgb 9.2  2.  Glaucoma due to type 2 diabetes mellitus/macular  degeneration of both eyes: is stable will continue combigan to both eyes twice daily travatan to both eyes  and lumigan to both eyes nightly   3. Insulin dependent type 2 diabetes mellitus, controlled: is stable hgb a1c 7.8 (previous 6.7); will continue humalog MIX 50/50: 30 units daily tradjenta 5 mg daily  4. Dyslipidemia associated with diabetes: is stable ldl 43; will continue crestor 10 mg daily   5.  Recurrent depression: is without change ; will continue lexapro to 20 mg daily   6.  CAD S/P percutaneous coronary angioplasty: is stable will continue asa 81 mg daily and  plavix 75 mg daily for total of 3 months   7. CKD stage 4 due to type 2 diabetes: is stable bun 9; creat 1.44  8. Hypothyroidism due to acquired atrophy of thyroid: is stable tsh 0.254 dose was adjusted in the hospiral will continue synthroid 37.5 mcg daily  9. CML (chronic myeloid leukemia) is without change is being followed by oncology will continue tasigna 400 mg twice daily   10. Hypertension associated with stage 4 chronic kidney disease due to type 2 diabetes mellitus:  is without change  b/p 155/0 will continue norvasc 10 mg daily and coreg  6.25 mg twice daily due to bradycardia  11. Failure to thrive in adult: weight is 116 pounds; will continue remeron 7.5 mg nightly for 21 days to help with appetite.  12. Acute multifocal left ACA territory infarcts: is neurologically stable: will continue asa 81 mg daily will continue plavix 75 mg daily for 3 months and will continue to monitor. Is on statin; will follow up with neurologically in 6 weeks.   Will get cbc; bmp Will setup a hospice consult         MD is aware of resident's narcotic use and is in agreement with current plan of care. We will attempt to wean resident as apropriate   Ok Edwards NP [facility]  Contact [phone] Monday through Friday 8am- 5pm  After hours call [phone]

## 2018-06-18 DIAGNOSIS — D649 Anemia, unspecified: Secondary | ICD-10-CM | POA: Diagnosis not present

## 2018-06-18 DIAGNOSIS — Z741 Need for assistance with personal care: Secondary | ICD-10-CM | POA: Diagnosis not present

## 2018-06-18 DIAGNOSIS — I63322 Cerebral infarction due to thrombosis of left anterior cerebral artery: Secondary | ICD-10-CM | POA: Diagnosis not present

## 2018-06-18 DIAGNOSIS — I6322 Cerebral infarction due to unspecified occlusion or stenosis of basilar arteries: Secondary | ICD-10-CM | POA: Diagnosis not present

## 2018-06-18 DIAGNOSIS — Z79899 Other long term (current) drug therapy: Secondary | ICD-10-CM | POA: Diagnosis not present

## 2018-06-18 DIAGNOSIS — M6281 Muscle weakness (generalized): Secondary | ICD-10-CM | POA: Diagnosis not present

## 2018-06-18 DIAGNOSIS — R293 Abnormal posture: Secondary | ICD-10-CM | POA: Diagnosis not present

## 2018-06-18 DIAGNOSIS — R1311 Dysphagia, oral phase: Secondary | ICD-10-CM | POA: Diagnosis not present

## 2018-06-18 LAB — CBC AND DIFFERENTIAL
HCT: 29 — AB (ref 36–46)
Hemoglobin: 9.7 — AB (ref 12.0–16.0)
Neutrophils Absolute: 2
PLATELETS: 163 (ref 150–399)
WBC: 4

## 2018-06-18 LAB — BASIC METABOLIC PANEL
BUN: 22 — AB (ref 4–21)
Creatinine: 1.7 — AB (ref 0.5–1.1)
GLUCOSE: 213
Potassium: 4.3 (ref 3.4–5.3)
Sodium: 142 (ref 137–147)

## 2018-06-19 DIAGNOSIS — M6281 Muscle weakness (generalized): Secondary | ICD-10-CM | POA: Diagnosis not present

## 2018-06-19 DIAGNOSIS — R1311 Dysphagia, oral phase: Secondary | ICD-10-CM | POA: Diagnosis not present

## 2018-06-19 DIAGNOSIS — I63322 Cerebral infarction due to thrombosis of left anterior cerebral artery: Secondary | ICD-10-CM | POA: Diagnosis not present

## 2018-06-19 DIAGNOSIS — R293 Abnormal posture: Secondary | ICD-10-CM | POA: Diagnosis not present

## 2018-06-19 DIAGNOSIS — I6322 Cerebral infarction due to unspecified occlusion or stenosis of basilar arteries: Secondary | ICD-10-CM | POA: Diagnosis not present

## 2018-06-19 DIAGNOSIS — Z741 Need for assistance with personal care: Secondary | ICD-10-CM | POA: Diagnosis not present

## 2018-06-20 DIAGNOSIS — M6281 Muscle weakness (generalized): Secondary | ICD-10-CM | POA: Diagnosis not present

## 2018-06-20 DIAGNOSIS — I63322 Cerebral infarction due to thrombosis of left anterior cerebral artery: Secondary | ICD-10-CM | POA: Diagnosis not present

## 2018-06-20 DIAGNOSIS — I6322 Cerebral infarction due to unspecified occlusion or stenosis of basilar arteries: Secondary | ICD-10-CM | POA: Diagnosis not present

## 2018-06-20 DIAGNOSIS — R1311 Dysphagia, oral phase: Secondary | ICD-10-CM | POA: Diagnosis not present

## 2018-06-20 DIAGNOSIS — R293 Abnormal posture: Secondary | ICD-10-CM | POA: Diagnosis not present

## 2018-06-20 DIAGNOSIS — Z741 Need for assistance with personal care: Secondary | ICD-10-CM | POA: Diagnosis not present

## 2018-06-21 ENCOUNTER — Encounter: Payer: Self-pay | Admitting: Internal Medicine

## 2018-06-21 ENCOUNTER — Non-Acute Institutional Stay (SKILLED_NURSING_FACILITY): Payer: Medicare Other | Admitting: Internal Medicine

## 2018-06-21 DIAGNOSIS — N184 Chronic kidney disease, stage 4 (severe): Secondary | ICD-10-CM

## 2018-06-21 DIAGNOSIS — E034 Atrophy of thyroid (acquired): Secondary | ICD-10-CM | POA: Diagnosis not present

## 2018-06-21 DIAGNOSIS — R627 Adult failure to thrive: Secondary | ICD-10-CM

## 2018-06-21 DIAGNOSIS — M6281 Muscle weakness (generalized): Secondary | ICD-10-CM | POA: Diagnosis not present

## 2018-06-21 DIAGNOSIS — I129 Hypertensive chronic kidney disease with stage 1 through stage 4 chronic kidney disease, or unspecified chronic kidney disease: Secondary | ICD-10-CM

## 2018-06-21 DIAGNOSIS — Z794 Long term (current) use of insulin: Secondary | ICD-10-CM | POA: Diagnosis not present

## 2018-06-21 DIAGNOSIS — I69359 Hemiplegia and hemiparesis following cerebral infarction affecting unspecified side: Secondary | ICD-10-CM | POA: Diagnosis not present

## 2018-06-21 DIAGNOSIS — R293 Abnormal posture: Secondary | ICD-10-CM | POA: Diagnosis not present

## 2018-06-21 DIAGNOSIS — I6322 Cerebral infarction due to unspecified occlusion or stenosis of basilar arteries: Secondary | ICD-10-CM | POA: Diagnosis not present

## 2018-06-21 DIAGNOSIS — R1311 Dysphagia, oral phase: Secondary | ICD-10-CM | POA: Diagnosis not present

## 2018-06-21 DIAGNOSIS — E1122 Type 2 diabetes mellitus with diabetic chronic kidney disease: Secondary | ICD-10-CM | POA: Diagnosis not present

## 2018-06-21 DIAGNOSIS — E43 Unspecified severe protein-calorie malnutrition: Secondary | ICD-10-CM | POA: Diagnosis not present

## 2018-06-21 DIAGNOSIS — E119 Type 2 diabetes mellitus without complications: Secondary | ICD-10-CM | POA: Diagnosis not present

## 2018-06-21 DIAGNOSIS — R5381 Other malaise: Secondary | ICD-10-CM | POA: Diagnosis not present

## 2018-06-21 DIAGNOSIS — Z741 Need for assistance with personal care: Secondary | ICD-10-CM | POA: Diagnosis not present

## 2018-06-21 DIAGNOSIS — I63322 Cerebral infarction due to thrombosis of left anterior cerebral artery: Secondary | ICD-10-CM | POA: Diagnosis not present

## 2018-06-21 MED FILL — TASIGNA 200 MG CAPSULE: 200 | 28 days supply | Qty: 112 | Fill #10

## 2018-06-21 NOTE — Progress Notes (Signed)
Patient ID: Stacy Hendricks, female   DOB: 09-17-1929, 84 y.o.   MRN: [id number]   Provider:  DR Arletha Grippe Location:  [place] Room Number: 219 A Place of Service:  SNF (31)  PCP: Crist Infante, MD Patient Care Team: Crist Infante, MD as PCP - General (Internal Medicine)  Extended Emergency Contact Information Primary Emergency Contact: Fletcher Anon [place] Phone: [phone] Relation: Son  Code Status: DNR Goals of Care: Advanced Directive information Advanced Directives 06/21/2018  Does Patient Have a Medical Advance Directive? Yes  Type of Advance Directive Out of facility DNR (pink MOST or yellow form)  Does patient want to make changes to medical advance directive? No - Patient declined  Copy of [place] in Chart? -  Would patient like information on creating a medical advance directive? No - Patient declined  Pre-existing out of facility DNR order (yellow form or pink MOST form) Yellow form placed in chart (order not valid for inpatient use);Pink MOST form placed in chart (order not valid for inpatient use)      Chief Complaint  Patient presents with  . Readmit To SNF    Readmission    HPI: Patient is a 84 y.o. female seen today for re-admission to SNF following hospital stay for CVA, acute metabolic encephalopathy, right 3rd finger laceration, hypothyroidism, OS conjunctivitis. MRI/MRA brain revealed acute multifocal left  ACA territory infarcts; old infarcts; severe intracranial stenosis; severe internal carotid atherosclerosis. Neurology and palliative care consulted. Moderate aspiration risk and family decided comfort feeds and no FT. A1c 6.7%; TSH 0.254. Thyroid med and insulin regimen adjusted. Bactrim Rx for conjunctivitis. She presents to SNF for comfort care.   Today she reports feeling "hungry". Appetite overall poor. She consumed 50% of lunch today which is better to minimum at other meal  times. She is a poor historian due to memory loss. Hx obtained from chart.  Anemia in neoplastic disease - stable; hgb 9.2  Glaucoma/ macular degeneration of both eyes - stable; uses combigan to both eyes twice daily; travatan to both eyes; lumigan to both eyes nightly   DM - borderline controlled; a1c 7.8% (previous 6.7%); humalog MIX 50/50: 30 units daily; tradjenta 5 mg daily  Dyslipidemia - stable on crestor 10 mg daily; LDL 43   Recurrent depression - mood stable on lexapro to 20 mg daily   CAD - s/p PCA; takes ASA 81 mg daily and  plavix 75 mg daily x 3 mos -->ASA alone  CKD - stage 4. Cr 1.44  Hypothyroidism  - stable on synthroid 37.5 mcg daily. TSH 0.254  CML (chronic myeloid leukemia) -  followed by oncology; takes tasigna 400 mg twice daily   Hypertension  - stable on norvasc 10 mg daily and coreg  6.25 mg twice daily due to bradycardia  FTT - takes remeron 7.5 mg nightly for 21 days to help with appetite.   Past Medical History:  Diagnosis Date  . Anemia   . Chronic kidney disease   . CML (chronic myeloid leukemia) ([place])   . Depression   . Diabetes mellitus without complication ([place])   . Hyperlipidemia   . Hypertension   . Thyroid disease    Past Surgical History:  Procedure Laterality Date  . APPENDECTOMY    . PTCA      reports that she has never smoked. She has never used smokeless tobacco. She reports that she does not drink alcohol or use drugs. Social  History   Socioeconomic History  . Marital status: Married    Spouse name: Not on file  . Number of children: Not on file  . Years of education: Not on file  . Highest education level: Not on file  Occupational History  . Not on file  Social Needs  . Financial resource strain: Not on file  . Food insecurity:    Worry: Not on file    Inability: Not on file  . Transportation needs:    Medical: Not on file    Non-medical: Not on file  Tobacco Use  . Smoking status: Never Smoker  . Smokeless  tobacco: Never Used  Substance and Sexual Activity  . Alcohol use: Never    Frequency: Never  . Drug use: Never  . Sexual activity: Not on file  Lifestyle  . Physical activity:    Days per week: Not on file    Minutes per session: Not on file  . Stress: Not on file  Relationships  . Social connections:    Talks on phone: Not on file    Gets together: Not on file    Attends religious service: Not on file    Active member of club or organization: Not on file    Attends meetings of clubs or organizations: Not on file    Relationship status: Not on file  . Intimate partner violence:    Fear of current or ex partner: Not on file    Emotionally abused: Not on file    Physically abused: Not on file    Forced sexual activity: Not on file  Other Topics Concern  . Not on file  Social History Narrative  . Not on file    Functional Status Survey:    Family History  Problem Relation Age of Onset  . Heart attack Father     Health Maintenance  Topic Date Due  . FOOT EXAM  07/05/2018 (Originally 03/20/1939)  . OPHTHALMOLOGY EXAM  07/05/2018 (Originally 03/20/1939)  . URINE MICROALBUMIN  07/05/2018 (Originally 03/20/1939)  . INFLUENZA VACCINE  08/03/2018 (Originally 04/26/2018)  . PNA vac Low Risk Adult (1 of 2 - PCV13) 05/04/2019 (Originally 03/20/1996)  . HEMOGLOBIN A1C  12/10/2018  . DEXA SCAN  Discontinued  . TETANUS/TDAP  Discontinued    No Known Allergies  Outpatient Encounter Medications as of 06/21/2018  Medication Sig  . acetaminophen (TYLENOL) 325 MG tablet Take 2 tablets (650 mg total) by mouth every 6 (six) hours as needed for mild pain.  Marland Kitchen amLODipine (NORVASC) 10 MG tablet Take 10 mg by mouth daily.   Marland Kitchen aspirin EC 81 MG EC tablet Take 1 tablet (81 mg total) by mouth daily.  . carvedilol (COREG) 6.25 MG tablet Take 6.25 mg by mouth 2 (two) times daily with a meal.   . clopidogrel (PLAVIX) 75 MG tablet Take 75 mg by mouth daily.   . COMBIGAN 0.2-0.5 % ophthalmic solution  Place 1 drop into both eyes every 12 (twelve) hours.   . docusate sodium (COLACE) 100 MG capsule Take 100 mg by mouth daily. Give 1 capsule by mouth daily   . ENSURE (ENSURE) Give 120 ml three times daily between meals  . escitalopram (LEXAPRO) 20 MG tablet Take 20 mg by mouth daily.   Marland Kitchen HUMALOG MIX 50/50 KWIKPEN (50-50) 100 UNIT/ML Kwikpen Inject 20 Units into the skin daily. Hold for cbg <100  . latanoprost (XALATAN) 0.005 % ophthalmic solution Place 1 drop into both eyes 2 (two) times daily.  Marland Kitchen  levothyroxine (SYNTHROID, LEVOTHROID) 25 MCG tablet Take 1.5 tablets (37.5 mcg total) by mouth daily before breakfast.  . linagliptin (TRADJENTA) 5 MG TABS tablet Take 5 mg by mouth daily.  . mirtazapine (REMERON) 7.5 MG tablet Take 7.5 mg by mouth at bedtime.  . nilotinib (TASIGNA) 200 MG capsule Give 1 capsule by mouth before meals. Take on an empty stomach, 1 hr before or 2 hrs after food.  . polyethylene glycol (MIRALAX / GLYCOLAX) packet Take 17 g by mouth daily. And as needed  . rosuvastatin (CRESTOR) 10 MG tablet Take 10 mg by mouth daily.   . [DISCONTINUED] LUMIGAN 0.01 % SOLN Place 1 drop into both eyes at bedtime.   . [DISCONTINUED] nilotinib (TASIGNA) 200 MG capsule Take 2 capsules (400 mg total) by mouth every 12 (twelve) hours. Give on an empty stomach 1 hour before or 2 hours after meals. (Patient not taking: Reported on 06/21/2018)   No facility-administered encounter medications on file as of 06/21/2018.     Review of Systems  Unable to perform ROS: Other (memory loss)    Vitals:   06/21/18 1052  BP: 118/60  Pulse: 64  Resp: 16  Temp: (!) 97.1 F (36.2 C)  SpO2: 99%  Weight: 119 lb 6.4 oz (54.2 kg)  Height: 5\' 8"  (1.727 m)   Body mass index is 18.15 kg/m. Physical Exam  Constitutional: She appears well-developed.  Frail appearing sitting up in bed in NAD  HENT:  Mouth/Throat: Oropharynx is clear and moist. No oropharyngeal exudate.  MMM; no oral thrush  Eyes: Pupils  are equal, round, and reactive to light. No scleral icterus.  Neck: Neck supple. Carotid bruit is present (b/l). No tracheal deviation present. No thyromegaly present.  Cardiovascular: Normal rate, regular rhythm and intact distal pulses. Exam reveals no gallop and no friction rub.  Murmur (2/6 SEM --> carotid b/l) heard. No LE edema b/l. no calf TTP.   Pulmonary/Chest: Effort normal and breath sounds normal. No stridor. No respiratory distress. She has no wheezes. She has no rales.  Abdominal: Soft. Normal appearance and bowel sounds are normal. She exhibits no distension and no mass. There is no hepatomegaly. There is no tenderness. There is no rigidity, no rebound and no guarding. No hernia.  Musculoskeletal: She exhibits edema (small joints).  Lymphadenopathy:    She has no cervical adenopathy.  Neurological: She is alert. She has normal reflexes.  Right hemiparesis  Skin: Skin is warm and dry. No rash noted.  Psychiatric: She has a normal mood and affect. Her behavior is normal. Thought content normal.    Labs reviewed: Basic Metabolic Panel: Recent Labs    12/26/17 1051  06/12/18 1055 06/13/18 0247 06/14/18 0241 06/18/18  NA 138   < > 139 139 138 142  K 4.0   < > 3.6 3.6 3.8 4.3  CL 106   < > 111 110 109  --   CO2 21*   < > 19* 21* 24  --   GLUCOSE 297*   < > 78 67* 106*  --   BUN 21   < > 9 9 9  22*  CREATININE 2.17*   < > 1.48* 1.47* 1.44* 1.7*  CALCIUM 9.6   < > 8.3* 8.2* 8.3*  --   MG 2.3  --  2.0 2.0  --   --   PHOS  --   --  2.4* 2.8  --   --    < > = values in this interval  not displayed.   Liver Function Tests: Recent Labs    12/26/17 1051 05/02/18 06/10/18 1850 06/11/18 0506 06/12/18 1055 06/13/18 0247  AST 12 12* 23 21  --   --   ALT 8 5* 22 20  --   --   ALKPHOS 71 80 156* 140*  --   --   BILITOT 0.3  --  1.1 0.9  --   --   PROT 7.1  --  6.3* 5.6*  --   --   ALBUMIN 3.7  --  3.0* 2.7* 3.1* 2.6*   No results for input(s): LIPASE, AMYLASE in the last  8760 hours. Recent Labs    06/11/18 0506  AMMONIA 17   CBC: Recent Labs    06/10/18 1850  06/11/18 0506 06/13/18 0247 06/18/18  WBC 5.5  --  4.2 5.3 4.0  NEUTROABS 3.9  --   --  3.5 2  HGB 10.0*   < > 9.1* 9.2* 9.7*  HCT 31.6*   < > 28.0* 28.1* 29*  MCV 91.6  --  88.9 89.5  --   PLT 141*  --  144* 120* 163   < > = values in this interval not displayed.   Cardiac Enzymes: No results for input(s): CKTOTAL, CKMB, CKMBINDEX, TROPONINI in the last 8760 hours. BNP: Invalid input(s): POCBNP Lab Results  Component Value Date   HGBA1C 7.8 (H) 06/11/2018   Lab Results  Component Value Date   TSH 0.254 (L) 06/11/2018   Lab Results  Component Value Date   VITAMINB12 576 06/11/2018   No results found for: FOLATE Lab Results  Component Value Date   IRON 53 06/11/2018   TIBC 221 (L) 06/11/2018   FERRITIN 115 06/11/2018    Imaging and Procedures obtained prior to SNF admission: Mr Brain Wo Contrast  Result Date: 06/11/2018 CLINICAL DATA:  Worsening confusion, RIGHT-sided weakness. History of stroke, blindness, hypertension and hyperlipidemia. EXAM: MRI HEAD WITHOUT CONTRAST MRA HEAD WITHOUT CONTRAST TECHNIQUE: Multiplanar, multiecho pulse sequences of the brain and surrounding structures were obtained without intravenous contrast. Angiographic images of the head were obtained using MRA technique without contrast. COMPARISON:  CT HEAD June 10, 2018 FINDINGS: MRI HEAD FINDINGS-moderately motion degraded examination. INTRACRANIAL CONTENTS: Patchy reduced diffusion mesial LEFT frontal parietal lobes including splenium of the corpus callosum with low ADC values. Susceptibility artifact LEFT lateral ventricle versus thalamus. A few scattered chronic microhemorrhages. LEFT frontal parietoccipital lobe encephalomalacia with ex vacuo dilatation LEFT occipital horn. Moderate to severe parenchymal brain volume loss. Old small LEFT cerebellar infarcts. Small area of RIGHT temporal lobe  encephalomalacia. Prominent basal ganglia perivascular spaces associated chronic small vessel ischemic changes. Patchy supratentorial white matter FLAIR T2 hyperintensities. No midline shift, mass effect or masses. No abnormal extra-axial fluid collections. VASCULAR: Normal major intracranial vascular flow voids present at skull base. SKULL AND UPPER CERVICAL SPINE: No abnormal sellar expansion. No suspicious calvarial bone marrow signal. Craniocervical junction maintained. SINUSES/ORBITS: The mastoid air-cells and included paranasal sinuses are well-aerated.The included ocular globes and orbital contents are non-suspicious. OTHER: Patient is edentulous. MRA HEAD FINDINGS ANTERIOR CIRCULATION: Normal flow related enhancement of the included cervical, petrous, cavernous and supraclinoid internal carotid arteries. Patent anterior communicating artery. LEFT A2 occlusion with reconstitution within 1 cm. Occluded LEFT M1 segments with reconstitution. Tandem severe stenosis anterior middle cerebral arteries. No aneurysm. POSTERIOR CIRCULATION: No LEFT vertebral artery flow related enhancement. Patent RIGHT vertebral artery and basilar artery. Severe stenosis bilateral SCA. Occluded distal basilar artery. Thready flow related  enhancement LEFT posterior cerebral artery from LEFT PCOM. Patent RIGHT posterior cerebral artery via posterior communicating artery. Tandem severe stenosis posterior cerebral arteries. No aneurysm. ANATOMIC VARIANTS: None. Source images and MIP images were reviewed. IMPRESSION: MRI HEAD: 1. Moderately motion degraded examination. Acute multifocal LEFT ACA territory nonhemorrhagic infarcts. 2. Old LEFT MCA/posterior border zone territory infarcts. Old RIGHT temporal/MCA territory infarct. Old small LEFT cerebellar infarct. 3. Moderate chronic small vessel ischemic changes. 4. Moderate to severe parenchymal brain volume loss. MRA HEAD: 1. Emergent LEFT A2 occlusion with reconstitution. 2. LEFT M1  occlusion with reconstitution, likely chronic. 3. Occluded basilar tip, tenuous PCA filling from posterior communicating arteries. 4. Slow flow versus occluded LEFT vertebral artery. 5. Severe intracranial atherosclerosis. These results will be called to the ordering clinician or representative by the Radiologist Assistant, and communication documented in the PACS or zVision Dashboard. Electronically Signed   By: Elon Alas M.D.   On: 06/11/2018 03:18   Dg Chest Portable 1 View  Result Date: 06/10/2018 CLINICAL DATA:  Cough. EXAM: PORTABLE CHEST 1 VIEW COMPARISON:  None. FINDINGS: Patient is rotated to the left. Lungs are adequately inflated without consolidation or effusion. Mild increased density over the midline neck base at the level of the sternal notch likely due to goiter. Mild cardiomegaly. Degenerative change of the spine. IMPRESSION: No acute cardiopulmonary disease. Mild cardiomegaly. Electronically Signed   By: Marin Olp M.D.   On: 06/10/2018 20:02   Mr Jodene Nam Head Wo Contrast  Result Date: 06/11/2018 CLINICAL DATA:  Worsening confusion, RIGHT-sided weakness. History of stroke, blindness, hypertension and hyperlipidemia. EXAM: MRI HEAD WITHOUT CONTRAST MRA HEAD WITHOUT CONTRAST TECHNIQUE: Multiplanar, multiecho pulse sequences of the brain and surrounding structures were obtained without intravenous contrast. Angiographic images of the head were obtained using MRA technique without contrast. COMPARISON:  CT HEAD June 10, 2018 FINDINGS: MRI HEAD FINDINGS-moderately motion degraded examination. INTRACRANIAL CONTENTS: Patchy reduced diffusion mesial LEFT frontal parietal lobes including splenium of the corpus callosum with low ADC values. Susceptibility artifact LEFT lateral ventricle versus thalamus. A few scattered chronic microhemorrhages. LEFT frontal parietoccipital lobe encephalomalacia with ex vacuo dilatation LEFT occipital horn. Moderate to severe parenchymal brain volume loss.  Old small LEFT cerebellar infarcts. Small area of RIGHT temporal lobe encephalomalacia. Prominent basal ganglia perivascular spaces associated chronic small vessel ischemic changes. Patchy supratentorial white matter FLAIR T2 hyperintensities. No midline shift, mass effect or masses. No abnormal extra-axial fluid collections. VASCULAR: Normal major intracranial vascular flow voids present at skull base. SKULL AND UPPER CERVICAL SPINE: No abnormal sellar expansion. No suspicious calvarial bone marrow signal. Craniocervical junction maintained. SINUSES/ORBITS: The mastoid air-cells and included paranasal sinuses are well-aerated.The included ocular globes and orbital contents are non-suspicious. OTHER: Patient is edentulous. MRA HEAD FINDINGS ANTERIOR CIRCULATION: Normal flow related enhancement of the included cervical, petrous, cavernous and supraclinoid internal carotid arteries. Patent anterior communicating artery. LEFT A2 occlusion with reconstitution within 1 cm. Occluded LEFT M1 segments with reconstitution. Tandem severe stenosis anterior middle cerebral arteries. No aneurysm. POSTERIOR CIRCULATION: No LEFT vertebral artery flow related enhancement. Patent RIGHT vertebral artery and basilar artery. Severe stenosis bilateral SCA. Occluded distal basilar artery. Thready flow related enhancement LEFT posterior cerebral artery from LEFT PCOM. Patent RIGHT posterior cerebral artery via posterior communicating artery. Tandem severe stenosis posterior cerebral arteries. No aneurysm. ANATOMIC VARIANTS: None. Source images and MIP images were reviewed. IMPRESSION: MRI HEAD: 1. Moderately motion degraded examination. Acute multifocal LEFT ACA territory nonhemorrhagic infarcts. 2. Old LEFT MCA/posterior border zone territory infarcts.  Old RIGHT temporal/MCA territory infarct. Old small LEFT cerebellar infarct. 3. Moderate chronic small vessel ischemic changes. 4. Moderate to severe parenchymal brain volume loss. MRA  HEAD: 1. Emergent LEFT A2 occlusion with reconstitution. 2. LEFT M1 occlusion with reconstitution, likely chronic. 3. Occluded basilar tip, tenuous PCA filling from posterior communicating arteries. 4. Slow flow versus occluded LEFT vertebral artery. 5. Severe intracranial atherosclerosis. These results will be called to the ordering clinician or representative by the Radiologist Assistant, and communication documented in the PACS or zVision Dashboard. Electronically Signed   By: Elon Alas M.D.   On: 06/11/2018 03:18   Ct Head Code Stroke Wo Contrast  Result Date: 06/10/2018 CLINICAL DATA:  Code stroke.  Generalized weakness and dysesthesias. EXAM: CT HEAD WITHOUT CONTRAST TECHNIQUE: Contiguous axial images were obtained from the base of the skull through the vertex without intravenous contrast. COMPARISON:  None. FINDINGS: Brain: The brainstem shows chronic small-vessel change of the pons. No focal cerebellar insult. Cerebral hemispheres show generalized atrophy. There chronic small-vessel ischemic changes of the white matter. No large vessel stroke seen in the right hemisphere. On the left, there is old cortical and subcortical infarction in the posterior frontal and the parietal region. No identifiable acute infarction. No mass lesion, hemorrhage, hydrocephalus or extra-axial collection. Vascular: There is atherosclerotic calcification of the major vessels at the base of the brain. Skull: Negative Sinuses/Orbits: Clear/normal Other: None ASPECTS: Unable to apply in the setting of old infarctions on the left and without lateralizing findings in the history. IMPRESSION: 1. No acute finding by CT. Atrophy. Old cortical and subcortical infarctions in the left frontal and left parietal regions. Chronic small-vessel ischemic changes elsewhere. 2. ASPECTS is not applicable given the absence of lateralizing signs and the extensive chronic changes. 3. These results were communicated to Dr. Rory Percy At 7:13 pmon  9/15/2019by text page via the [facility] messaging system. Electronically Signed   By: Nelson Chimes M.D.   On: 06/10/2018 19:14    Assessment/Plan   ICD-10-CM   1. FTT (failure to thrive) in adult R62.7   2. Severe protein-calorie malnutrition ([place]) E43   3. Physical deconditioning R53.81   4. Hemiparesis due to recent cerebrovascular accident (CVA) ([place]) I69.359   5. Hypertension associated with stage 4 chronic kidney disease due to type 2 diabetes mellitus ([facility]) E11.22    I12.9    N18.4   6. Hypothyroidism due to acquired atrophy of thyroid E03.4   7. Insulin dependent type 2 diabetes mellitus, controlled ([place]) E11.9    Z79.4    Cont current meds as ordered  Prognosis is poor overall - Hospice following  Cont nutritional supplements as ordered  PT/OT/ST as indicated to keep her comfortable  GOAL: hospice; long term care. Communicated with pt and nursing.  Will follow  Labs/tests ordered: none  Stacy Hendricks S. Perlie Gold  [facility] [address] [place], [place] [address] [phone] Cell (Monday-Friday 8 AM - 5 PM) [phone] After 5 PM and follow prompts

## 2018-06-22 DIAGNOSIS — I63322 Cerebral infarction due to thrombosis of left anterior cerebral artery: Secondary | ICD-10-CM | POA: Diagnosis not present

## 2018-06-22 DIAGNOSIS — M6281 Muscle weakness (generalized): Secondary | ICD-10-CM | POA: Diagnosis not present

## 2018-06-22 DIAGNOSIS — R1311 Dysphagia, oral phase: Secondary | ICD-10-CM | POA: Diagnosis not present

## 2018-06-22 DIAGNOSIS — R293 Abnormal posture: Secondary | ICD-10-CM | POA: Diagnosis not present

## 2018-06-22 DIAGNOSIS — F331 Major depressive disorder, recurrent, moderate: Secondary | ICD-10-CM | POA: Diagnosis not present

## 2018-06-22 DIAGNOSIS — Z741 Need for assistance with personal care: Secondary | ICD-10-CM | POA: Diagnosis not present

## 2018-06-22 DIAGNOSIS — I6322 Cerebral infarction due to unspecified occlusion or stenosis of basilar arteries: Secondary | ICD-10-CM | POA: Diagnosis not present

## 2018-06-22 DIAGNOSIS — G4701 Insomnia due to medical condition: Secondary | ICD-10-CM | POA: Diagnosis not present

## 2018-06-25 DIAGNOSIS — R1311 Dysphagia, oral phase: Secondary | ICD-10-CM | POA: Diagnosis not present

## 2018-06-25 DIAGNOSIS — Z741 Need for assistance with personal care: Secondary | ICD-10-CM | POA: Diagnosis not present

## 2018-06-25 DIAGNOSIS — I63322 Cerebral infarction due to thrombosis of left anterior cerebral artery: Secondary | ICD-10-CM | POA: Diagnosis not present

## 2018-06-25 DIAGNOSIS — I6322 Cerebral infarction due to unspecified occlusion or stenosis of basilar arteries: Secondary | ICD-10-CM | POA: Diagnosis not present

## 2018-06-25 DIAGNOSIS — R293 Abnormal posture: Secondary | ICD-10-CM | POA: Diagnosis not present

## 2018-06-25 DIAGNOSIS — M6281 Muscle weakness (generalized): Secondary | ICD-10-CM | POA: Diagnosis not present

## 2018-06-26 DIAGNOSIS — M6281 Muscle weakness (generalized): Secondary | ICD-10-CM | POA: Diagnosis not present

## 2018-06-26 DIAGNOSIS — R293 Abnormal posture: Secondary | ICD-10-CM | POA: Diagnosis not present

## 2018-06-26 DIAGNOSIS — Z741 Need for assistance with personal care: Secondary | ICD-10-CM | POA: Diagnosis not present

## 2018-06-26 DIAGNOSIS — R1311 Dysphagia, oral phase: Secondary | ICD-10-CM | POA: Diagnosis not present

## 2018-06-26 DIAGNOSIS — I6322 Cerebral infarction due to unspecified occlusion or stenosis of basilar arteries: Secondary | ICD-10-CM | POA: Diagnosis not present

## 2018-06-26 DIAGNOSIS — I63322 Cerebral infarction due to thrombosis of left anterior cerebral artery: Secondary | ICD-10-CM | POA: Diagnosis not present

## 2018-06-27 DIAGNOSIS — Z741 Need for assistance with personal care: Secondary | ICD-10-CM | POA: Diagnosis not present

## 2018-06-27 DIAGNOSIS — I6322 Cerebral infarction due to unspecified occlusion or stenosis of basilar arteries: Secondary | ICD-10-CM | POA: Diagnosis not present

## 2018-06-27 DIAGNOSIS — R1311 Dysphagia, oral phase: Secondary | ICD-10-CM | POA: Diagnosis not present

## 2018-06-27 DIAGNOSIS — I63322 Cerebral infarction due to thrombosis of left anterior cerebral artery: Secondary | ICD-10-CM | POA: Diagnosis not present

## 2018-06-27 DIAGNOSIS — R293 Abnormal posture: Secondary | ICD-10-CM | POA: Diagnosis not present

## 2018-06-27 DIAGNOSIS — M6281 Muscle weakness (generalized): Secondary | ICD-10-CM | POA: Diagnosis not present

## 2018-06-28 DIAGNOSIS — I63322 Cerebral infarction due to thrombosis of left anterior cerebral artery: Secondary | ICD-10-CM | POA: Diagnosis not present

## 2018-06-28 DIAGNOSIS — Z741 Need for assistance with personal care: Secondary | ICD-10-CM | POA: Diagnosis not present

## 2018-06-28 DIAGNOSIS — R293 Abnormal posture: Secondary | ICD-10-CM | POA: Diagnosis not present

## 2018-06-28 DIAGNOSIS — I6322 Cerebral infarction due to unspecified occlusion or stenosis of basilar arteries: Secondary | ICD-10-CM | POA: Diagnosis not present

## 2018-06-28 DIAGNOSIS — M6281 Muscle weakness (generalized): Secondary | ICD-10-CM | POA: Diagnosis not present

## 2018-06-28 DIAGNOSIS — R1311 Dysphagia, oral phase: Secondary | ICD-10-CM | POA: Diagnosis not present

## 2018-06-29 ENCOUNTER — Encounter: Payer: Self-pay | Admitting: Adult Health

## 2018-06-29 ENCOUNTER — Non-Acute Institutional Stay (SKILLED_NURSING_FACILITY): Payer: Medicare Other | Admitting: Adult Health

## 2018-06-29 DIAGNOSIS — E119 Type 2 diabetes mellitus without complications: Secondary | ICD-10-CM | POA: Diagnosis not present

## 2018-06-29 DIAGNOSIS — Z794 Long term (current) use of insulin: Secondary | ICD-10-CM

## 2018-06-29 DIAGNOSIS — D630 Anemia in neoplastic disease: Secondary | ICD-10-CM

## 2018-06-29 DIAGNOSIS — E1139 Type 2 diabetes mellitus with other diabetic ophthalmic complication: Secondary | ICD-10-CM | POA: Diagnosis not present

## 2018-06-29 DIAGNOSIS — R1311 Dysphagia, oral phase: Secondary | ICD-10-CM | POA: Diagnosis not present

## 2018-06-29 DIAGNOSIS — H42 Glaucoma in diseases classified elsewhere: Secondary | ICD-10-CM | POA: Diagnosis not present

## 2018-06-29 DIAGNOSIS — I6322 Cerebral infarction due to unspecified occlusion or stenosis of basilar arteries: Secondary | ICD-10-CM | POA: Diagnosis not present

## 2018-06-29 DIAGNOSIS — Z741 Need for assistance with personal care: Secondary | ICD-10-CM | POA: Diagnosis not present

## 2018-06-29 DIAGNOSIS — I63322 Cerebral infarction due to thrombosis of left anterior cerebral artery: Secondary | ICD-10-CM | POA: Diagnosis not present

## 2018-06-29 DIAGNOSIS — E1169 Type 2 diabetes mellitus with other specified complication: Secondary | ICD-10-CM | POA: Diagnosis not present

## 2018-06-29 DIAGNOSIS — M6281 Muscle weakness (generalized): Secondary | ICD-10-CM | POA: Diagnosis not present

## 2018-06-29 DIAGNOSIS — E785 Hyperlipidemia, unspecified: Secondary | ICD-10-CM

## 2018-06-29 DIAGNOSIS — R293 Abnormal posture: Secondary | ICD-10-CM | POA: Diagnosis not present

## 2018-06-29 NOTE — Progress Notes (Signed)
Location:   [facility] Room Number: 219 A Place of Service:  SNF ([id number])   CODE STATUS: DNR  No Known Allergies  Chief Complaint  Patient presents with  . Medical Management of Chronic Issues    Anemia; glaucoma; diabetes; dyslipidemia. Weekly follow up for the first 30 days post hospitalization     HPI:  She is a 84 year old long term resident of this facility being seen for the management of her chronic illnesses; anemia; glaucoma; diabetes; dyslipidemia. She is unable to participate in the hpi or ros. There are no reports of agitation; no fevers; her appetite continues to be poor despite being on remeron.   Past Medical History:  Diagnosis Date  . Anemia   . Chronic kidney disease   . CML (chronic myeloid leukemia) ([place])   . Depression   . Diabetes mellitus without complication ([place])   . Hyperlipidemia   . Hypertension   . Thyroid disease     Past Surgical History:  Procedure Laterality Date  . APPENDECTOMY    . PTCA      Social History   Socioeconomic History  . Marital status: Married    Spouse name: Not on file  . Number of children: Not on file  . Years of education: Not on file  . Highest education level: Not on file  Occupational History  . Not on file  Social Needs  . Financial resource strain: Not on file  . Food insecurity:    Worry: Not on file    Inability: Not on file  . Transportation needs:    Medical: Not on file    Non-medical: Not on file  Tobacco Use  . Smoking status: Never Smoker  . Smokeless tobacco: Never Used  Substance and Sexual Activity  . Alcohol use: Never    Frequency: Never  . Drug use: Never  . Sexual activity: Not on file  Lifestyle  . Physical activity:    Days per week: Not on file    Minutes per session: Not on file  . Stress: Not on file  Relationships  . Social connections:    Talks on phone: Not on file    Gets together: Not on file    Attends religious service: Not on file    Active  member of club or organization: Not on file    Attends meetings of clubs or organizations: Not on file    Relationship status: Not on file  . Intimate partner violence:    Fear of current or ex partner: Not on file    Emotionally abused: Not on file    Physically abused: Not on file    Forced sexual activity: Not on file  Other Topics Concern  . Not on file  Social History Narrative  . Not on file   Family History  Problem Relation Age of Onset  . Heart attack Father       VITAL SIGNS Ht 5\' 1"  (1.549 m)   Wt 116 lb 6.4 oz (52.8 kg)   BMI 21.99 kg/m   Outpatient Encounter Medications as of 06/29/2018  Medication Sig  . acetaminophen (TYLENOL) 325 MG tablet Take 2 tablets (650 mg total) by mouth every 6 (six) hours as needed for mild pain.  Marland Kitchen amLODipine (NORVASC) 10 MG tablet Take 10 mg by mouth daily.   Marland Kitchen aspirin EC 81 MG EC tablet Take 1 tablet (81 mg total) by mouth daily.  . carvedilol (COREG) 6.25 MG tablet  Take 6.25 mg by mouth 2 (two) times daily with a meal.   . clopidogrel (PLAVIX) 75 MG tablet Take 75 mg by mouth daily.   . COMBIGAN 0.2-0.5 % ophthalmic solution Place 1 drop into both eyes every 12 (twelve) hours.   . docusate sodium (COLACE) 100 MG capsule Take 100 mg by mouth daily. Give 1 capsule by mouth daily   . ENSURE (ENSURE) Give 120 ml three times daily between meals  . escitalopram (LEXAPRO) 20 MG tablet Take 20 mg by mouth daily.   Marland Kitchen HUMALOG MIX 50/50 KWIKPEN (50-50) 100 UNIT/ML Kwikpen Inject 20 Units into the skin daily. Hold for cbg <100  . latanoprost (XALATAN) 0.005 % ophthalmic solution Place 1 drop into both eyes 2 (two) times daily.  Marland Kitchen levothyroxine (SYNTHROID, LEVOTHROID) 25 MCG tablet Take 1.5 tablets (37.5 mcg total) by mouth daily before breakfast.  . linagliptin (TRADJENTA) 5 MG TABS tablet Take 5 mg by mouth daily.  . mirtazapine (REMERON) 7.5 MG tablet Take 7.5 mg by mouth at bedtime.  . nilotinib (TASIGNA) 200 MG capsule Give 1 capsule by  mouth before meals. Take on an empty stomach, 1 hr before or 2 hrs after food.  . NON FORMULARY Regular Diet - Pureed texture, Thin liquids consistency  . polyethylene glycol (MIRALAX / GLYCOLAX) packet Take 17 g by mouth daily. And as needed  . rosuvastatin (CRESTOR) 10 MG tablet Take 10 mg by mouth daily.    No facility-administered encounter medications on file as of 06/29/2018.      SIGNIFICANT DIAGNOSTIC EXAMS  PREVIOUS:   06-10-18: chest x-ray: No acute cardiopulmonary disease. Mild cardiomegaly.  06-10-18: ct of head: 1. No acute finding by CT. Atrophy. Old cortical and subcortical infarctions in the left frontal and left parietal regions. Chronic small-vessel ischemic changes elsewhere. 2. ASPECTS is not applicable given the absence of lateralizing signs and the extensive chronic changes.  06-11-18: 2-d echo:  - Left ventricle: The cavity size was normal. There was mild concentric hypertrophy. Systolic function was normal. The estimated ejection fraction was in the range of 55% to 60%. Wall motion was normal; there were no regional wall motion abnormalities. Doppler parameters are consistent with abnormal left ventricular relaxation (grade 1 diastolic dysfunction). - Aortic valve: Trileaflet; moderately thickened, moderately calcified leaflets. There was trivial regurgitation. - Mitral valve: Calcified annulus. - Left atrium: The atrium was mildly dilated  06-11-18: MRI/MRA head:  MRI HEAD: 1. Moderately motion degraded examination. Acute multifocal LEFT ACA territory nonhemorrhagic infarcts. 2. Old LEFT MCA/posterior border zone territory infarcts. Old RIGHT temporal/MCA territory infarct. Old small LEFT cerebellar infarct. 3. Moderate chronic small vessel ischemic changes. 4. Moderate to severe parenchymal brain volume loss.  MRA HEAD: 1. Emergent LEFT A2 occlusion with reconstitution. 2. LEFT M1 occlusion with reconstitution, likely chronic. 3. Occluded basilar tip,  tenuous PCA filling from posterior communicating arteries. 4. Slow flow versus occluded LEFT vertebral artery. 5. Severe intracranial atherosclerosis.  NO NEW EXAMS    LABS REVIEWED: PREVIOUS:   05-02-18: wbc 9,3; hgb 10.7; hct 32.5; mcv 85.8; plt 175; glucose 201; bun 23.1; creat 1.64; k+ 3.8; na++ 143; ca 8.9; liver normal albumin 4.2; chol 172; ldl 96; trig 138; hdl 49; hgb a1c 6.7 06-10-18: wbc  5.5; hgb 10.0; ht 31.6; mcv 91.6; plt 141; glucose 243; bun 15; creat 2.03; k+ 3.7; na++ 139; ca 8.2; liver normal albumin 3.0 06-11-18: hgb a1c  7.8 chol 108; ldl 43; trig 139; trig 37 tsh 0.254  Vit  B 12: 576 iron 53; tibc 221; ferritin 115 06-13-18: wbc 5.3; hgb 9.2; hct 281. mcv 89.5; plt 120 glucose 67; bun 9; creat 1.47; k+ 3.6; na++ 139; ca 8.2; phos 2.8; albumin 2.6 mag 2.0 06-14-18: glucose 106; bun 9; creat 1.44; k+ 3.8; na++138; ca 8.3   TODAY.   06-18-18: wbc 4.0; hgb 9.7; hct 28.8; mcv 88.3 plt 163; glucose 213; bun 21.8; creat 1.68; k+ 4.3; na++ 142 ca 9.0    Review of Systems  Unable to perform ROS: Dementia (unable to participate )    Physical Exam  Constitutional: No distress.  Frail   Eyes:  Blind both eyes   Neck: No thyromegaly present.  Cardiovascular: Normal rate, regular rhythm and intact distal pulses.  Murmur heard. 3/6  Pulmonary/Chest: Effort normal and breath sounds normal. No respiratory distress.  Abdominal: Soft. Bowel sounds are normal. She exhibits no distension. There is no tenderness.  Musculoskeletal: She exhibits no edema.  Is able to move all extremities   Lymphadenopathy:    She has no cervical adenopathy.  Neurological:  Is aware   Skin: Skin is warm and dry. She is not diaphoretic.    ASSESSMENT/ PLAN:  TODAY ;  1. Anemia in neoplastic disease: is stable hgb 9.7  2.  Glaucoma due to type 2 diabetes mellitus/macular degeneration of both eyes: is stable will continue combigan to both eyes twice daily travatan to both eyes  and lumigan to  both eyes nightly   3. Insulin dependent type 2 diabetes mellitus, controlled: is stable hgb a1c 7.8 (previous 6.7); will continue humalog MIX 50/50: 30 units daily tradjenta 5 mg daily  4. Dyslipidemia associated with diabetes: is stable ldl 43; will continue crestor 10 mg daily   PREVIOUS  5.  Recurrent depression: is without change ; will continue lexapro to 20 mg daily   6.  CAD S/P percutaneous coronary angioplasty: is stable will continue asa 81 mg daily and  plavix 75 mg daily for total of 3 months  7. CKD stage 4 due to type 2 diabetes: is stable bun 21.8; creat 1.68  8. Hypothyroidism due to acquired atrophy of thyroid: is stable tsh 0.254 dose was adjusted in the hospiral will continue synthroid 37.5 mcg daily  9. CML (chronic myeloid leukemia) is without change is being followed by oncology will continue tasigna 400 mg twice daily   10. Hypertension associated with stage 4 chronic kidney disease due to type 2 diabetes mellitus:  is without change  b/p 150/85 will continue norvasc 10 mg daily and coreg  6.25 mg twice daily due to bradycardia  11. Failure to thrive in adult: weight is 116 pounds; will continue remeron 7.5 mg nightly for 21 days to help with appetite.  12. Acute multifocal left ACA territory infarcts: is neurologically stable: will continue asa 81 mg daily will continue plavix 75 mg daily for 3 months and will continue to monitor. Is on statin; will follow up with neurologically in 6 weeks.    MD is aware of resident's narcotic use and is in agreement with current plan of care. We will attempt to wean resident as apropriate   Ok Edwards NP [facility]  Contact [phone] Monday through Friday 8am- 5pm  After hours call [phone]

## 2018-06-30 DIAGNOSIS — Z741 Need for assistance with personal care: Secondary | ICD-10-CM | POA: Diagnosis not present

## 2018-06-30 DIAGNOSIS — R293 Abnormal posture: Secondary | ICD-10-CM | POA: Diagnosis not present

## 2018-06-30 DIAGNOSIS — M6281 Muscle weakness (generalized): Secondary | ICD-10-CM | POA: Diagnosis not present

## 2018-06-30 DIAGNOSIS — R1311 Dysphagia, oral phase: Secondary | ICD-10-CM | POA: Diagnosis not present

## 2018-06-30 DIAGNOSIS — I63322 Cerebral infarction due to thrombosis of left anterior cerebral artery: Secondary | ICD-10-CM | POA: Diagnosis not present

## 2018-06-30 DIAGNOSIS — I6322 Cerebral infarction due to unspecified occlusion or stenosis of basilar arteries: Secondary | ICD-10-CM | POA: Diagnosis not present

## 2018-07-02 DIAGNOSIS — R293 Abnormal posture: Secondary | ICD-10-CM | POA: Diagnosis not present

## 2018-07-02 DIAGNOSIS — I63322 Cerebral infarction due to thrombosis of left anterior cerebral artery: Secondary | ICD-10-CM | POA: Diagnosis not present

## 2018-07-02 DIAGNOSIS — R1311 Dysphagia, oral phase: Secondary | ICD-10-CM | POA: Diagnosis not present

## 2018-07-02 DIAGNOSIS — Z741 Need for assistance with personal care: Secondary | ICD-10-CM | POA: Diagnosis not present

## 2018-07-02 DIAGNOSIS — M6281 Muscle weakness (generalized): Secondary | ICD-10-CM | POA: Diagnosis not present

## 2018-07-02 DIAGNOSIS — I6322 Cerebral infarction due to unspecified occlusion or stenosis of basilar arteries: Secondary | ICD-10-CM | POA: Diagnosis not present

## 2018-07-03 DIAGNOSIS — M6281 Muscle weakness (generalized): Secondary | ICD-10-CM | POA: Diagnosis not present

## 2018-07-03 DIAGNOSIS — I6322 Cerebral infarction due to unspecified occlusion or stenosis of basilar arteries: Secondary | ICD-10-CM | POA: Diagnosis not present

## 2018-07-03 DIAGNOSIS — R1311 Dysphagia, oral phase: Secondary | ICD-10-CM | POA: Diagnosis not present

## 2018-07-03 DIAGNOSIS — Z741 Need for assistance with personal care: Secondary | ICD-10-CM | POA: Diagnosis not present

## 2018-07-03 DIAGNOSIS — R293 Abnormal posture: Secondary | ICD-10-CM | POA: Diagnosis not present

## 2018-07-03 DIAGNOSIS — I63322 Cerebral infarction due to thrombosis of left anterior cerebral artery: Secondary | ICD-10-CM | POA: Diagnosis not present

## 2018-07-04 ENCOUNTER — Non-Acute Institutional Stay (SKILLED_NURSING_FACILITY): Payer: Medicare Other | Admitting: Adult Health

## 2018-07-04 ENCOUNTER — Encounter: Payer: Self-pay | Admitting: Adult Health

## 2018-07-04 DIAGNOSIS — R627 Adult failure to thrive: Secondary | ICD-10-CM

## 2018-07-04 DIAGNOSIS — E785 Hyperlipidemia, unspecified: Secondary | ICD-10-CM

## 2018-07-04 DIAGNOSIS — E119 Type 2 diabetes mellitus without complications: Secondary | ICD-10-CM

## 2018-07-04 DIAGNOSIS — R1311 Dysphagia, oral phase: Secondary | ICD-10-CM | POA: Diagnosis not present

## 2018-07-04 DIAGNOSIS — R293 Abnormal posture: Secondary | ICD-10-CM | POA: Diagnosis not present

## 2018-07-04 DIAGNOSIS — I63322 Cerebral infarction due to thrombosis of left anterior cerebral artery: Secondary | ICD-10-CM | POA: Diagnosis not present

## 2018-07-04 DIAGNOSIS — Z794 Long term (current) use of insulin: Secondary | ICD-10-CM | POA: Diagnosis not present

## 2018-07-04 DIAGNOSIS — M6281 Muscle weakness (generalized): Secondary | ICD-10-CM | POA: Diagnosis not present

## 2018-07-04 DIAGNOSIS — E1169 Type 2 diabetes mellitus with other specified complication: Secondary | ICD-10-CM | POA: Diagnosis not present

## 2018-07-04 DIAGNOSIS — I6322 Cerebral infarction due to unspecified occlusion or stenosis of basilar arteries: Secondary | ICD-10-CM | POA: Diagnosis not present

## 2018-07-04 DIAGNOSIS — Z741 Need for assistance with personal care: Secondary | ICD-10-CM | POA: Diagnosis not present

## 2018-07-04 NOTE — Progress Notes (Signed)
Location:   [facility] Room Number: 219 A Place of Service:  SNF (31)   CODE STATUS: DNR  No Known Allergies  Chief Complaint  Patient presents with  . Medical Management of Chronic Issues    Dyslipidemia; diabetes; failure to thrive: weekly follow up for the first 30 days post hospitalization.     HPI:  She is a 84 year old long term resident of this facility being seen for the management of her chronic illnesses: dyslipidemia; diabetes; failure to thrive. She was started on remeron to help with appetite; but this has not been effective. Her cbg's are all elevated; she continues to have a poor appetite; there are no indications of pain present.   Past Medical History:  Diagnosis Date  . Anemia   . Chronic kidney disease   . CML (chronic myeloid leukemia) ([place])   . Depression   . Diabetes mellitus without complication ([place])   . Hyperlipidemia   . Hypertension   . Thyroid disease     Past Surgical History:  Procedure Laterality Date  . APPENDECTOMY    . PTCA      Social History   Socioeconomic History  . Marital status: Married    Spouse name: Not on file  . Number of children: Not on file  . Years of education: Not on file  . Highest education level: Not on file  Occupational History  . Not on file  Social Needs  . Financial resource strain: Not on file  . Food insecurity:    Worry: Not on file    Inability: Not on file  . Transportation needs:    Medical: Not on file    Non-medical: Not on file  Tobacco Use  . Smoking status: Never Smoker  . Smokeless tobacco: Never Used  Substance and Sexual Activity  . Alcohol use: Never    Frequency: Never  . Drug use: Never  . Sexual activity: Not on file  Lifestyle  . Physical activity:    Days per week: Not on file    Minutes per session: Not on file  . Stress: Not on file  Relationships  . Social connections:    Talks on phone: Not on file    Gets together: Not on file    Attends  religious service: Not on file    Active member of club or organization: Not on file    Attends meetings of clubs or organizations: Not on file    Relationship status: Not on file  . Intimate partner violence:    Fear of current or ex partner: Not on file    Emotionally abused: Not on file    Physically abused: Not on file    Forced sexual activity: Not on file  Other Topics Concern  . Not on file  Social History Narrative  . Not on file   Family History  Problem Relation Age of Onset  . Heart attack Father       VITAL SIGNS BP 140/68   Pulse 66   Temp 97.8 F (36.6 C)   Resp 18   Ht 5\' 1"  (1.549 m)   Wt 116 lb 6.4 oz (52.8 kg)   SpO2 98%   BMI 21.99 kg/m   Outpatient Encounter Medications as of 07/04/2018  Medication Sig  . acetaminophen (TYLENOL) 325 MG tablet Take 2 tablets (650 mg total) by mouth every 6 (six) hours as needed for mild pain.  Marland Kitchen amLODipine (NORVASC) 10 MG tablet  Take 10 mg by mouth daily.   Marland Kitchen aspirin EC 81 MG EC tablet Take 1 tablet (81 mg total) by mouth daily.  . carvedilol (COREG) 6.25 MG tablet Take 6.25 mg by mouth 2 (two) times daily with a meal.   . clopidogrel (PLAVIX) 75 MG tablet Take 75 mg by mouth daily. X 3 months, ending on 09/15/18  . COMBIGAN 0.2-0.5 % ophthalmic solution Place 1 drop into both eyes every 12 (twelve) hours.   . docusate sodium (COLACE) 100 MG capsule Take 100 mg by mouth daily. Give 1 capsule by mouth daily   . ENSURE (ENSURE) Give 120 ml three times daily between meals  . escitalopram (LEXAPRO) 20 MG tablet Take 20 mg by mouth daily.   Marland Kitchen HUMALOG MIX 50/50 KWIKPEN (50-50) 100 UNIT/ML Kwikpen Inject as per sliding scale subcutaneously in the morning: 0 - 70 = Notify MD for BS <70 71 - 200 = 0 units 201 - 399 = 20 units For BS = or > 400 notify MD  . latanoprost (XALATAN) 0.005 % ophthalmic solution Place 1 drop into both eyes 2 (two) times daily.  Marland Kitchen levothyroxine (SYNTHROID, LEVOTHROID) 25 MCG tablet Take 1.5 tablets  (37.5 mcg total) by mouth daily before breakfast.  . linagliptin (TRADJENTA) 5 MG TABS tablet Take 5 mg by mouth daily.  . mirtazapine (REMERON) 7.5 MG tablet Take 7.5 mg by mouth at bedtime.  . nilotinib (TASIGNA) 200 MG capsule Give 2 capsule (400 mg) by mouth before meals. Take on an empty stomach, 1 hr before or 2 hrs after food.  . NON FORMULARY Regular Diet - Pureed texture, Thin liquids consistency  . polyethylene glycol (MIRALAX / GLYCOLAX) packet Take 17 g by mouth daily. And as needed  . rosuvastatin (CRESTOR) 10 MG tablet Take 10 mg by mouth daily.    No facility-administered encounter medications on file as of 07/04/2018.      SIGNIFICANT DIAGNOSTIC EXAMS  PREVIOUS:   06-10-18: chest x-ray: No acute cardiopulmonary disease. Mild cardiomegaly.  06-10-18: ct of head: 1. No acute finding by CT. Atrophy. Old cortical and subcortical infarctions in the left frontal and left parietal regions. Chronic small-vessel ischemic changes elsewhere. 2. ASPECTS is not applicable given the absence of lateralizing signs and the extensive chronic changes.  06-11-18: 2-d echo:  - Left ventricle: The cavity size was normal. There was mild concentric hypertrophy. Systolic function was normal. The estimated ejection fraction was in the range of 55% to 60%. Wall motion was normal; there were no regional wall motion abnormalities. Doppler parameters are consistent with abnormal left ventricular relaxation (grade 1 diastolic dysfunction). - Aortic valve: Trileaflet; moderately thickened, moderately calcified leaflets. There was trivial regurgitation. - Mitral valve: Calcified annulus. - Left atrium: The atrium was mildly dilated  06-11-18: MRI/MRA head:  MRI HEAD: 1. Moderately motion degraded examination. Acute multifocal LEFT ACA territory nonhemorrhagic infarcts. 2. Old LEFT MCA/posterior border zone territory infarcts. Old RIGHT temporal/MCA territory infarct. Old small LEFT cerebellar infarct. 3.  Moderate chronic small vessel ischemic changes. 4. Moderate to severe parenchymal brain volume loss.  MRA HEAD: 1. Emergent LEFT A2 occlusion with reconstitution. 2. LEFT M1 occlusion with reconstitution, likely chronic. 3. Occluded basilar tip, tenuous PCA filling from posterior communicating arteries. 4. Slow flow versus occluded LEFT vertebral artery. 5. Severe intracranial atherosclerosis.  NO NEW EXAMS    LABS REVIEWED: PREVIOUS:   05-02-18: wbc 9,3; hgb 10.7; hct 32.5; mcv 85.8; plt 175; glucose 201; bun 23.1; creat 1.64; k+  3.8; na++ 143; ca 8.9; liver normal albumin 4.2; chol 172; ldl 96; trig 138; hdl 49; hgb a1c 6.7 06-10-18: wbc  5.5; hgb 10.0; ht 31.6; mcv 91.6; plt 141; glucose 243; bun 15; creat 2.03; k+ 3.7; na++ 139; ca 8.2; liver normal albumin 3.0 06-11-18: hgb a1c  7.8 chol 108; ldl 43; trig 139; trig 37 tsh 0.254  Vit B 12: 576 iron 53; tibc 221; ferritin 115 06-13-18: wbc 5.3; hgb 9.2; hct 281. mcv 89.5; plt 120 glucose 67; bun 9; creat 1.47; k+ 3.6; na++ 139; ca 8.2; phos 2.8; albumin 2.6 mag 2.0 06-14-18: glucose 106; bun 9; creat 1.44; k+ 3.8; na++138; ca 8.3  06-18-18: wbc 4.0; hgb 9.7; hct 28.8; mcv 88.3 plt 163; glucose 213; bun 21.8; creat 1.68; k+ 4.3; na++ 142 ca 9.0   NO NEW LABS     Review of Systems  Unable to perform ROS: Dementia (confusion )   Physical Exam  Constitutional: No distress.  Frail   Eyes:  Blind both eyes   Neck: No thyromegaly present.  Cardiovascular: Normal rate, regular rhythm and intact distal pulses.  Murmur heard. 3/6  Pulmonary/Chest: Effort normal and breath sounds normal. No respiratory distress.  Abdominal: Soft. Bowel sounds are normal. She exhibits no distension. There is no tenderness.  Musculoskeletal: Normal range of motion. She exhibits no edema.  Is able to move all extremities   Lymphadenopathy:    She has no cervical adenopathy.  Neurological:  Is aware   Skin: Skin is warm and dry. She is not diaphoretic.  There is pallor.  unstage left lateral foot ulceration: is dry; will use skin prep at this time.      ASSESSMENT/ PLAN:  TODAY   1. Failure to thrive in adult: weight is 116 pounds; will continue continue supplements as directed will stop remeron as this medication has not been effective  2. Insulin dependent type 2 diabetes mellitus, controlled: cbg's are elevated  hgb a1c 7.8 (previous 6.7); will stop humalog 50/50 mix will begin humalog 5 units with meals.   3. Dyslipidemia associated with diabetes: is stable ldl 43; will stop crestor due to her weight loss/failure to thrive   PREVIOUS  4.  Recurrent depression: is without change ; will continue lexapro to 20 mg daily   5.  CAD S/P percutaneous coronary angioplasty: is stable will continue asa 81 mg daily and  plavix 75 mg daily for total of 3 months  6. CKD stage 4 due to type 2 diabetes: is stable bun 21.8; creat 1.68  7. Acute multifocal left ACA territory infarcts: is neurologically stable: will continue asa 81 mg daily will continue plavix 75 mg daily for 3 months and will continue to monitor.  8. Anemia in neoplastic disease: is stable hgb 9.7  9.  Glaucoma due to type 2 diabetes mellitus/macular degeneration of both eyes: is stable will continue combigan to both eyes twice daily travatan to both eyes  and lumigan to both eyes nightly   10. Hypothyroidism due to acquired atrophy of thyroid: is stable tsh 0.254 dose was adjusted in the hospiral will continue synthroid 37.5 mcg daily  11. CML (chronic myeloid leukemia) is without change is being followed by oncology will continue tasigna 400 mg twice daily   12. Hypertension associated with stage 4 chronic kidney disease due to type 2 diabetes mellitus:  is without change  b/p 140/68 will continue norvasc 10 mg daily and coreg  6.25 mg twice daily due to bradycardia  MD is aware of resident's narcotic use and is in agreement with current plan of care. We will attempt to  wean resident as apropriate   Ok Edwards NP [facility]  Contact [phone] Monday through Friday 8am- 5pm  After hours call [phone]

## 2018-07-08 DIAGNOSIS — I10 Essential (primary) hypertension: Secondary | ICD-10-CM

## 2018-07-08 DIAGNOSIS — E039 Hypothyroidism, unspecified: Secondary | ICD-10-CM | POA: Diagnosis not present

## 2018-07-08 DIAGNOSIS — E109 Type 1 diabetes mellitus without complications: Secondary | ICD-10-CM | POA: Diagnosis not present

## 2018-07-08 DIAGNOSIS — F0280 Dementia in other diseases classified elsewhere without behavioral disturbance: Secondary | ICD-10-CM | POA: Diagnosis not present

## 2018-07-08 DIAGNOSIS — G309 Alzheimer's disease, unspecified: Secondary | ICD-10-CM | POA: Diagnosis not present

## 2018-07-08 DIAGNOSIS — F339 Major depressive disorder, recurrent, unspecified: Secondary | ICD-10-CM | POA: Diagnosis not present

## 2018-07-08 DIAGNOSIS — I2510 Atherosclerotic heart disease of native coronary artery without angina pectoris: Secondary | ICD-10-CM | POA: Diagnosis not present

## 2018-07-08 DIAGNOSIS — C9590 Leukemia, unspecified not having achieved remission: Secondary | ICD-10-CM | POA: Diagnosis not present

## 2018-07-11 NOTE — Progress Notes (Signed)
No show

## 2018-07-12 ENCOUNTER — Inpatient Hospital Stay: Payer: Medicare Other | Attending: Oncology | Admitting: Oncology

## 2018-07-12 ENCOUNTER — Non-Acute Institutional Stay (SKILLED_NURSING_FACILITY): Payer: Medicare Other | Admitting: Adult Health

## 2018-07-12 ENCOUNTER — Encounter: Payer: Self-pay | Admitting: Adult Health

## 2018-07-12 ENCOUNTER — Inpatient Hospital Stay: Payer: Medicare Other

## 2018-07-12 DIAGNOSIS — D630 Anemia in neoplastic disease: Secondary | ICD-10-CM

## 2018-07-12 DIAGNOSIS — Z9861 Coronary angioplasty status: Secondary | ICD-10-CM | POA: Diagnosis not present

## 2018-07-12 DIAGNOSIS — I2510 Atherosclerotic heart disease of native coronary artery without angina pectoris: Secondary | ICD-10-CM

## 2018-07-12 DIAGNOSIS — I63322 Cerebral infarction due to thrombosis of left anterior cerebral artery: Secondary | ICD-10-CM

## 2018-07-12 DIAGNOSIS — F339 Major depressive disorder, recurrent, unspecified: Secondary | ICD-10-CM | POA: Diagnosis not present

## 2018-07-12 NOTE — Progress Notes (Signed)
Location:   [facility] Room Number: 219 A Place of Service:  SNF (31)   CODE STATUS: DNR  No Known Allergies  Chief Complaint  Patient presents with  . Medical Management of Chronic Issues    CAD S/P percutaneous coronary angioplasty; cerebrovascular accident(CVA) due to thrombus of left anterior cerebral artery; anemia in neoplastic disease; depression, recurrent     HPI:  She is a 84 year old long term resident of this facility being seen for the management of her chronic illnesses: cad; cva; anemia; depression. She is followed by hospice care. She is unable to participate in the hpi or ros. There are no reports of fevers; agitation; anxiety; no indications of pain.   Past Medical History:  Diagnosis Date  . Anemia   . Chronic kidney disease   . CML (chronic myeloid leukemia) ([place])   . Depression   . Diabetes mellitus without complication ([address])   . Hyperlipidemia   . Hypertension   . Thyroid disease     Past Surgical History:  Procedure Laterality Date  . APPENDECTOMY    . PTCA      Social History   Socioeconomic History  . Marital status: Married    Spouse name: Not on file  . Number of children: Not on file  . Years of education: Not on file  . Highest education level: Not on file  Occupational History  . Not on file  Social Needs  . Financial resource strain: Not on file  . Food insecurity:    Worry: Not on file    Inability: Not on file  . Transportation needs:    Medical: Not on file    Non-medical: Not on file  Tobacco Use  . Smoking status: Never Smoker  . Smokeless tobacco: Never Used  Substance and Sexual Activity  . Alcohol use: Never    Frequency: Never  . Drug use: Never  . Sexual activity: Not on file  Lifestyle  . Physical activity:    Days per week: Not on file    Minutes per session: Not on file  . Stress: Not on file  Relationships  . Social connections:    Talks on phone: Not on file    Gets together: Not on  file    Attends religious service: Not on file    Active member of club or organization: Not on file    Attends meetings of clubs or organizations: Not on file    Relationship status: Not on file  . Intimate partner violence:    Fear of current or ex partner: Not on file    Emotionally abused: Not on file    Physically abused: Not on file    Forced sexual activity: Not on file  Other Topics Concern  . Not on file  Social History Narrative  . Not on file   Family History  Problem Relation Age of Onset  . Heart attack Father       VITAL SIGNS BP 113/63   Pulse 73   Temp 98.1 F (36.7 C)   Resp 18   Ht 5\' 1"  (1.549 m)   Wt 124 lb 14.4 oz (56.7 kg)   SpO2 99%   BMI 23.60 kg/m   Outpatient Encounter Medications as of 07/12/2018  Medication Sig  . acetaminophen (TYLENOL) 325 MG tablet Take 2 tablets (650 mg total) by mouth every 6 (six) hours as needed for mild pain.  Marland Kitchen amLODipine (NORVASC) 10 MG tablet Take  10 mg by mouth daily.   Marland Kitchen aspirin EC 81 MG EC tablet Take 1 tablet (81 mg total) by mouth daily.  . carvedilol (COREG) 6.25 MG tablet Take 6.25 mg by mouth 2 (two) times daily with a meal.   . clopidogrel (PLAVIX) 75 MG tablet Take 75 mg by mouth daily. X 3 months, ending on 09/15/18  . COMBIGAN 0.2-0.5 % ophthalmic solution Place 1 drop into both eyes every 12 (twelve) hours.   . docusate sodium (COLACE) 100 MG capsule Take 100 mg by mouth daily. Give 1 capsule by mouth daily   . ENSURE (ENSURE) Give 120 ml three times daily between meals  . escitalopram (LEXAPRO) 20 MG tablet Take 20 mg by mouth daily.   . insulin lispro (HUMALOG) 100 UNIT/ML injection Inject 5 Units into the skin. Daily with meals  . latanoprost (XALATAN) 0.005 % ophthalmic solution Place 1 drop into both eyes 2 (two) times daily.  Marland Kitchen levothyroxine (SYNTHROID, LEVOTHROID) 25 MCG tablet Take 1.5 tablets (37.5 mcg total) by mouth daily before breakfast.  . linagliptin (TRADJENTA) 5 MG TABS tablet Take 5  mg by mouth daily.  . nilotinib (TASIGNA) 200 MG capsule Give 2 capsule (400 mg) by mouth before meals. Take on an empty stomach, 1 hr before or 2 hrs after food.  . NON FORMULARY Regular Diet - Pureed texture, Thin liquids consistency  . polyethylene glycol (MIRALAX / GLYCOLAX) packet Take 17 g by mouth daily. And as needed   No facility-administered encounter medications on file as of 07/12/2018.      SIGNIFICANT DIAGNOSTIC EXAMS   PREVIOUS:   06-10-18: chest x-ray: No acute cardiopulmonary disease. Mild cardiomegaly.  06-10-18: ct of head: 1. No acute finding by CT. Atrophy. Old cortical and subcortical infarctions in the left frontal and left parietal regions. Chronic small-vessel ischemic changes elsewhere. 2. ASPECTS is not applicable given the absence of lateralizing signs and the extensive chronic changes.  06-11-18: 2-d echo:  - Left ventricle: The cavity size was normal. There was mild concentric hypertrophy. Systolic function was normal. The estimated ejection fraction was in the range of 55% to 60%. Wall motion was normal; there were no regional wall motion abnormalities. Doppler parameters are consistent with abnormal left ventricular relaxation (grade 1 diastolic dysfunction). - Aortic valve: Trileaflet; moderately thickened, moderately calcified leaflets. There was trivial regurgitation. - Mitral valve: Calcified annulus. - Left atrium: The atrium was mildly dilated  06-11-18: MRI/MRA head:  MRI HEAD: 1. Moderately motion degraded examination. Acute multifocal LEFT ACA territory nonhemorrhagic infarcts. 2. Old LEFT MCA/posterior border zone territory infarcts. Old RIGHT temporal/MCA territory infarct. Old small LEFT cerebellar infarct. 3. Moderate chronic small vessel ischemic changes. 4. Moderate to severe parenchymal brain volume loss.  MRA HEAD: 1. Emergent LEFT A2 occlusion with reconstitution. 2. LEFT M1 occlusion with reconstitution, likely chronic. 3. Occluded  basilar tip, tenuous PCA filling from posterior communicating arteries. 4. Slow flow versus occluded LEFT vertebral artery. 5. Severe intracranial atherosclerosis.  NO NEW EXAMS    LABS REVIEWED: PREVIOUS:   05-02-18: wbc 9,3; hgb 10.7; hct 32.5; mcv 85.8; plt 175; glucose 201; bun 23.1; creat 1.64; k+ 3.8; na++ 143; ca 8.9; liver normal albumin 4.2; chol 172; ldl 96; trig 138; hdl 49; hgb a1c 6.7 06-10-18: wbc  5.5; hgb 10.0; ht 31.6; mcv 91.6; plt 141; glucose 243; bun 15; creat 2.03; k+ 3.7; na++ 139; ca 8.2; liver normal albumin 3.0 06-11-18: hgb a1c  7.8 chol 108; ldl 43; trig 139;  trig 37 tsh 0.254  Vit B 12: 576 iron 53; tibc 221; ferritin 115 06-13-18: wbc 5.3; hgb 9.2; hct 281. mcv 89.5; plt 120 glucose 67; bun 9; creat 1.47; k+ 3.6; na++ 139; ca 8.2; phos 2.8; albumin 2.6 mag 2.0 06-14-18: glucose 106; bun 9; creat 1.44; k+ 3.8; na++138; ca 8.3  06-18-18: wbc 4.0; hgb 9.7; hct 28.8; mcv 88.3 plt 163; glucose 213; bun 21.8; creat 1.68; k+ 4.3; na++ 142 ca 9.0   NO NEW LABS   Review of Systems  Unable to perform ROS: Dementia (confusion )   Physical Exam  Constitutional: No distress.  Frail   Eyes:  Blind both eyes    Neck: No thyromegaly present.  Cardiovascular: Normal rate, regular rhythm and intact distal pulses.  Murmur heard. 3/6  Pulmonary/Chest: Effort normal and breath sounds normal. No respiratory distress.  Abdominal: Soft. Bowel sounds are normal. She exhibits no distension. There is no tenderness.  Musculoskeletal: She exhibits no edema.  Is able to move all extremities   Lymphadenopathy:    She has no cervical adenopathy.  Neurological:  Is aware of surroundings.   Skin: Skin is warm and dry. She is not diaphoretic. There is pallor.  unstage left lateral foot ulceration:     ASSESSMENT/ PLAN:  TODAY   1.  Recurrent depression: is without change ; will continue lexapro to 20 mg daily   2.  CAD S/P percutaneous coronary angioplasty: is stable will  continue asa 81 mg daily will stop plavix  3. CVA: is neurologically stable: will continue asa 81 mg daily will stop plavix  4. Anemia in neoplastic disease: is stable hgb 9.7  PREVIOUS  5.  Glaucoma due to type 2 diabetes mellitus/macular degeneration of both eyes: is stable will continue combigan to both eyes twice daily travatan to both eyes  and lumigan to both eyes nightly   6. Hypothyroidism due to acquired atrophy of thyroid: is stable tsh 0.254 dose was adjusted in the hospiral will continue synthroid 37.5 mcg daily  7. CML (chronic myeloid leukemia) is without change is being followed by oncology will stope tasigna at this time.   8. Hypertension associated with stage 4 chronic kidney disease due to type 2 diabetes mellitus:  is without change  b/p 113/63 will continue norvasc 10 mg daily and coreg  6.25 mg twice daily   9. Failure to thrive in adult: weight is 124 pounds; will continue continue supplements as directed will stop remeron as this medication has not been effective  10. Insulin dependent type 2 diabetes mellitus, controlled: cbg's are elevated  hgb a1c 7.8 (previous 6.7); will continue humalog 5 units with meals. Will stop tradjenta   11. Dyslipidemia associated with diabetes: is stable ldl 43; is off medication     MD is aware of resident's narcotic use and is in agreement with current plan of care. We will attempt to wean resident as apropriate   Ok Edwards NP [facility]  Contact [phone] Monday through Friday 8am- 5pm  After hours call [phone]

## 2018-07-16 ENCOUNTER — Other Ambulatory Visit: Payer: Self-pay | Admitting: Oncology

## 2018-07-16 NOTE — Progress Notes (Signed)
I called the patient's son Vlad as the patient had not shown for her visit last week.  He tells me his mother had a very large stroke, is not able to talk or move much, is being spoon fed, and is in a nursing home under hospice care.  Accordingly we are canceling all further appointments here.  I let them know that we will be glad to help in any way we can but it looks like to have it covered for now.

## 2018-07-17 ENCOUNTER — Ambulatory Visit: Payer: Medicare Other | Admitting: Adult Health

## 2018-07-25 DIAGNOSIS — I2510 Atherosclerotic heart disease of native coronary artery without angina pectoris: Secondary | ICD-10-CM | POA: Diagnosis not present

## 2018-07-25 DIAGNOSIS — E785 Hyperlipidemia, unspecified: Secondary | ICD-10-CM | POA: Diagnosis not present

## 2018-07-25 DIAGNOSIS — E039 Hypothyroidism, unspecified: Secondary | ICD-10-CM | POA: Diagnosis not present

## 2018-07-26 DIAGNOSIS — G4701 Insomnia due to medical condition: Secondary | ICD-10-CM | POA: Diagnosis not present

## 2018-07-26 DIAGNOSIS — F331 Major depressive disorder, recurrent, moderate: Secondary | ICD-10-CM | POA: Diagnosis not present

## 2018-08-28 DIAGNOSIS — F331 Major depressive disorder, recurrent, moderate: Secondary | ICD-10-CM | POA: Diagnosis not present

## 2018-08-28 DIAGNOSIS — G4701 Insomnia due to medical condition: Secondary | ICD-10-CM | POA: Diagnosis not present

## 2018-09-06 DIAGNOSIS — C9210 Chronic myeloid leukemia, BCR/ABL-positive, not having achieved remission: Secondary | ICD-10-CM | POA: Diagnosis not present

## 2018-09-06 DIAGNOSIS — R627 Adult failure to thrive: Secondary | ICD-10-CM | POA: Diagnosis not present

## 2018-09-06 DIAGNOSIS — I639 Cerebral infarction, unspecified: Secondary | ICD-10-CM | POA: Diagnosis not present

## 2018-09-06 DIAGNOSIS — I10 Essential (primary) hypertension: Secondary | ICD-10-CM | POA: Diagnosis not present

## 2018-09-07 DIAGNOSIS — I10 Essential (primary) hypertension: Secondary | ICD-10-CM | POA: Diagnosis not present

## 2018-09-07 DIAGNOSIS — I639 Cerebral infarction, unspecified: Secondary | ICD-10-CM | POA: Diagnosis not present

## 2018-09-07 DIAGNOSIS — C9210 Chronic myeloid leukemia, BCR/ABL-positive, not having achieved remission: Secondary | ICD-10-CM | POA: Diagnosis not present

## 2018-09-07 DIAGNOSIS — L89510 Pressure ulcer of right ankle, unstageable: Secondary | ICD-10-CM | POA: Diagnosis not present

## 2018-09-11 DIAGNOSIS — G4701 Insomnia due to medical condition: Secondary | ICD-10-CM | POA: Diagnosis not present

## 2018-09-11 DIAGNOSIS — F331 Major depressive disorder, recurrent, moderate: Secondary | ICD-10-CM | POA: Diagnosis not present

## 2018-09-26 DEATH — deceased

## 2020-01-22 IMAGING — DX DG CHEST 1V PORT
1 series · 1 of 1 positions shown · non-contrast
Comparison: None.

CLINICAL DATA: Cough.

EXAM:
PORTABLE CHEST 1 VIEW

[chest]
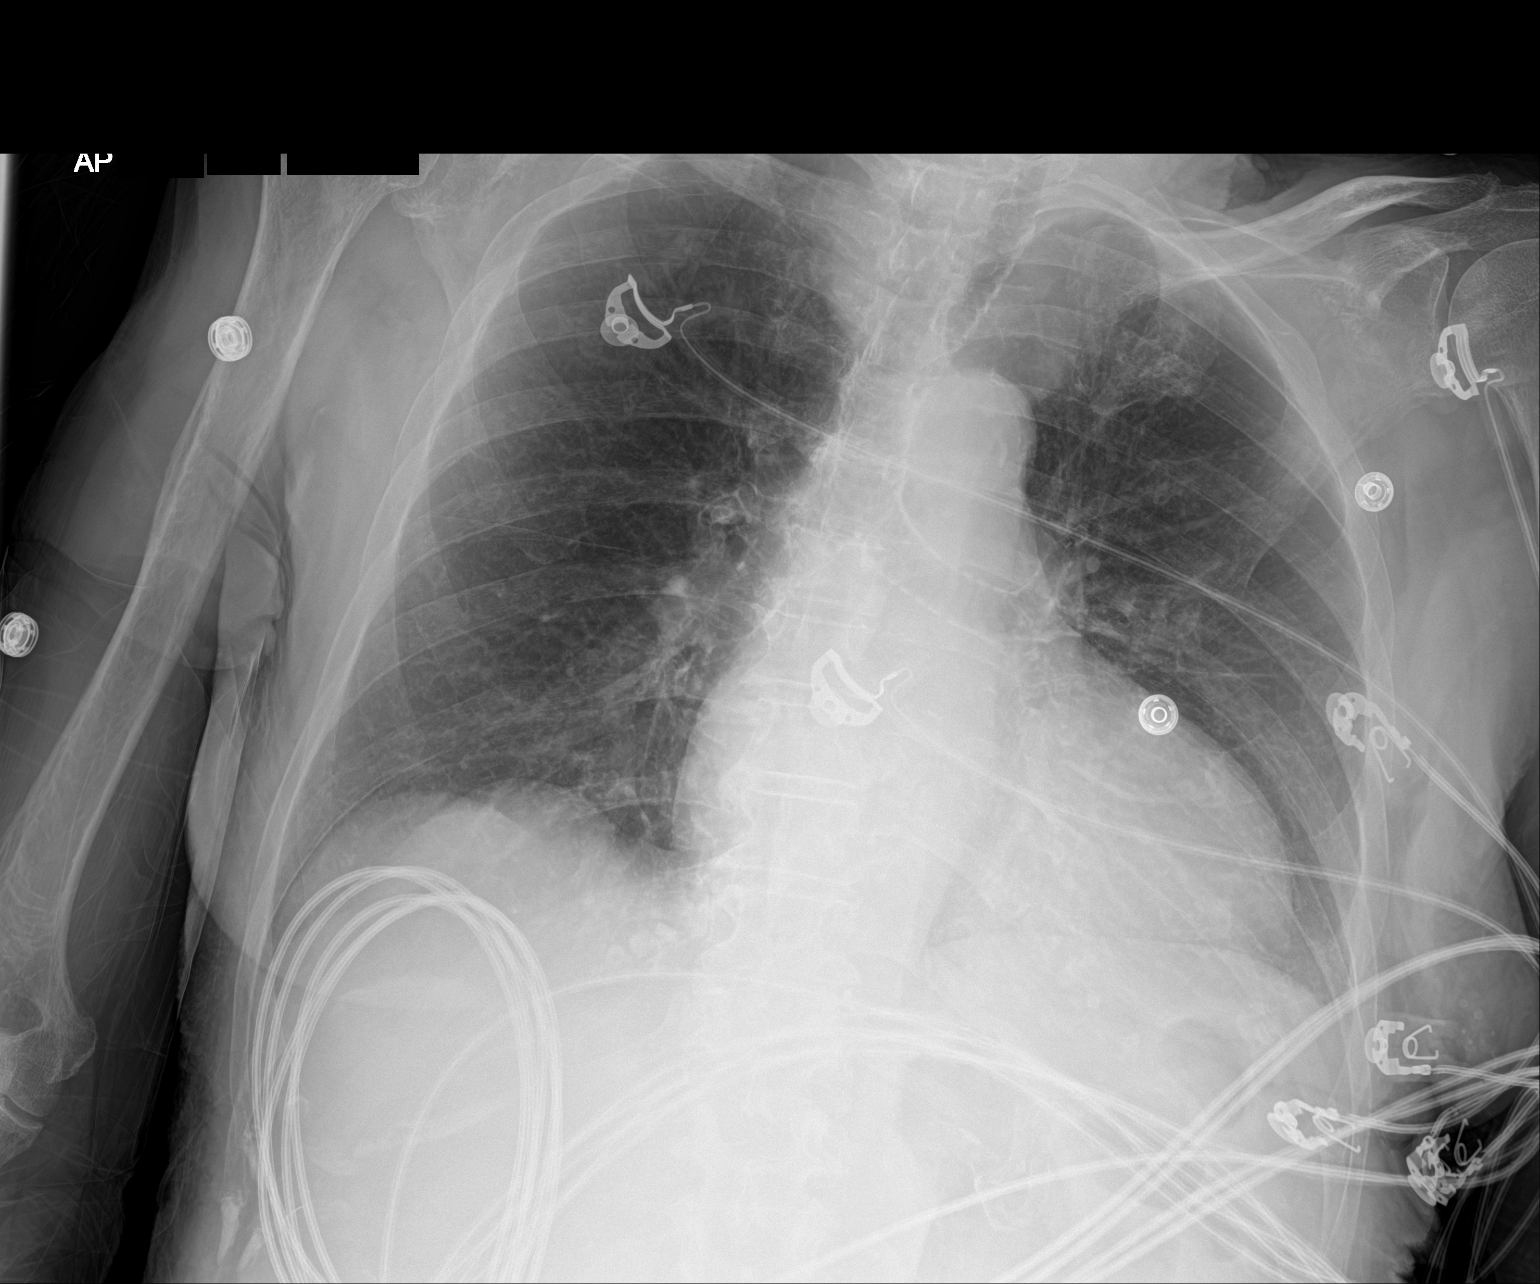

[1 of 1 positions shown; findings below may reference images not displayed]

FINDINGS: Patient is rotated to the left. Lungs are adequately inflated
without consolidation or effusion. Mild increased density over the
midline neck base at the level of the sternal notch likely due to
goiter. Mild cardiomegaly. Degenerative change of the spine.
IMPRESSION: No acute cardiopulmonary disease.

Mild cardiomegaly.
